# Patient Record
Sex: Male | Born: 1965 | Race: White | Hispanic: No | Marital: Married | Smoking: Never smoker
Health system: Southern US, Community
[De-identification: ages and names within clinical notes are randomized; demographics above are authoritative.]

## PROBLEM LIST (undated history)

## (undated) DIAGNOSIS — I10 Essential (primary) hypertension: Secondary | ICD-10-CM

## (undated) DIAGNOSIS — G8929 Other chronic pain: Secondary | ICD-10-CM

## (undated) DIAGNOSIS — K921 Melena: Secondary | ICD-10-CM

## (undated) DIAGNOSIS — F329 Major depressive disorder, single episode, unspecified: Secondary | ICD-10-CM

## (undated) DIAGNOSIS — F32A Depression, unspecified: Secondary | ICD-10-CM

## (undated) DIAGNOSIS — J302 Other seasonal allergic rhinitis: Secondary | ICD-10-CM

## (undated) DIAGNOSIS — R51 Headache: Secondary | ICD-10-CM

## (undated) DIAGNOSIS — F419 Anxiety disorder, unspecified: Secondary | ICD-10-CM

## (undated) DIAGNOSIS — B019 Varicella without complication: Secondary | ICD-10-CM

## (undated) DIAGNOSIS — K580 Irritable bowel syndrome with diarrhea: Secondary | ICD-10-CM

## (undated) DIAGNOSIS — K589 Irritable bowel syndrome without diarrhea: Secondary | ICD-10-CM

## (undated) HISTORY — DX: Depression, unspecified: F32.A

## (undated) HISTORY — DX: Varicella without complication: B01.9

## (undated) HISTORY — DX: Irritable bowel syndrome with diarrhea: K58.0

## (undated) HISTORY — DX: Essential (primary) hypertension: I10

## (undated) HISTORY — DX: Anxiety disorder, unspecified: F41.9

## (undated) HISTORY — DX: Major depressive disorder, single episode, unspecified: F32.9

## (undated) HISTORY — DX: Other chronic pain: G89.29

## (undated) HISTORY — DX: Melena: K92.1

## (undated) HISTORY — DX: Irritable bowel syndrome without diarrhea: K58.9

## (undated) HISTORY — DX: Headache: R51

## (undated) HISTORY — DX: Other seasonal allergic rhinitis: J30.2

---
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
## 1993-04-19 HISTORY — PX: ANAL FISTULECTOMY: SHX1139

## 1994-04-19 HISTORY — PX: ANAL FISTULECTOMY: SHX1139

## 2007-04-20 HISTORY — PX: COLONOSCOPY: SHX174

## 2012-04-19 HISTORY — PX: COLONOSCOPY: SHX174

## 2012-07-03 ENCOUNTER — Telehealth: Payer: Self-pay | Admitting: *Deleted

## 2012-07-03 ENCOUNTER — Encounter: Payer: Self-pay | Admitting: Internal Medicine

## 2012-07-03 ENCOUNTER — Ambulatory Visit (INDEPENDENT_AMBULATORY_CARE_PROVIDER_SITE_OTHER): Payer: BC Managed Care – PPO | Admitting: Internal Medicine

## 2012-07-03 VITALS — BP 120/80 | HR 69 | Temp 98.0°F | Resp 16 | Ht 72.0 in | Wt 212.2 lb

## 2012-07-03 DIAGNOSIS — K5190 Ulcerative colitis, unspecified, without complications: Secondary | ICD-10-CM

## 2012-07-03 DIAGNOSIS — R635 Abnormal weight gain: Secondary | ICD-10-CM

## 2012-07-03 DIAGNOSIS — Z87448 Personal history of other diseases of urinary system: Secondary | ICD-10-CM | POA: Insufficient documentation

## 2012-07-03 DIAGNOSIS — F411 Generalized anxiety disorder: Secondary | ICD-10-CM

## 2012-07-03 DIAGNOSIS — E349 Endocrine disorder, unspecified: Secondary | ICD-10-CM

## 2012-07-03 DIAGNOSIS — F418 Other specified anxiety disorders: Secondary | ICD-10-CM | POA: Insufficient documentation

## 2012-07-03 DIAGNOSIS — K589 Irritable bowel syndrome without diarrhea: Secondary | ICD-10-CM | POA: Insufficient documentation

## 2012-07-03 DIAGNOSIS — E291 Testicular hypofunction: Secondary | ICD-10-CM

## 2012-07-03 MED ORDER — ALPRAZOLAM 0.5 MG PO TABS: 0.5000 mg | ORAL_TABLET | Freq: Every evening | ORAL | Status: DC | PRN

## 2012-07-03 MED ORDER — ESCITALOPRAM OXALATE 20 MG PO TABS: 20.0000 mg | ORAL_TABLET | Freq: Every day | ORAL | Status: DC

## 2012-07-03 NOTE — Assessment & Plan Note (Signed)
Repeat level to be checked.  Will consider Endocrine referral.

## 2012-07-03 NOTE — Assessment & Plan Note (Signed)
Primary symptoms is insomnia despite regular use of lexapro. Trial of low dose alprazolam.

## 2012-07-03 NOTE — Assessment & Plan Note (Signed)
Diagnosed by colonoscopy in 2010.  No treatment despite recurrent bloody stools.  We discussed referral to [facility] or  [facility], (Dr Mechele Collin) for treatment and surveillance colonoscopy .

## 2012-07-03 NOTE — Progress Notes (Signed)
Patient ID: Travis Bennett, male   DOB: [birth year], 47 y.o.   MRN: [id number]  Patient Active Problem List  Diagnosis  . Ulcerative colitis  . History of hematuria  . Generalized anxiety disorder  . Hypotestosteronism    Subjective:  CC:   Chief Complaint  Patient presents with  . Establish Care    HPI:   Travis Bennett is a 47 y.o. male who presents as a new patient to establish primary care with the chief complaint of Diarrhea.  His first colonoscopy was at age 63 in [place]  for FH of colon CA.  His father had 10 polyps at age 3  [facility] had colon ca.  Upper sigmoid flex 2 years ago,  Which confirmed ulcerative colitis which was diagnosed after last colonoscopy in 2010.  His conditrion is aggravated by diet and stress. His UC has not been treated with medications despite frequent flares aggravated bu use of caffeine.  He has bloody stools at least monthly,  With 2 to 4 days of bleeding than a few weeks without any symptoms .  Even when not bleeding he has 5 to 10 stools daily,.   He has a history of an episode of hematuria that was evaluated by Urology .  Workup was unlcear  No source was found. .   Weight gain ,  Has never been over 200 lbs.  Not exercising for the past 6 months, due to nasty divorce  New job, new move. Quality of life is better.  Relocated in [place].  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  Anxiety:  His prior PCP  Started lexapro 20 mg . He has recurrent  Insomnia   And has not taken  [place] or sonata in over a year .  He finds himself making lists at night of things he needs to do which is keeping him awake. He has joint custody of children.  Low normal testosterone.  He has tried testosterone injections which lowered his levels even more due to positive feedback inhibition.      Past Medical History  Diagnosis Date  . Blood in stool   . Chicken pox   . Depression   . Chronic headaches   .  Ulcerative colitis     Past Surgical History  Procedure Laterality Date  . Tonsillectomy and adenoidectomy  1970  . Anal fistulectomy  1996  . Anal fistulectomy N/A 1995    Family History  Problem Relation Age of Onset  . Hyperlipidemia Mother   . Cancer Maternal Grandmother     Ovarian  . Alcohol abuse Maternal Grandmother   . Alzheimer's disease Paternal Grandmother   . Cancer Paternal Grandfather     colon  . Heart disease Paternal Grandfather   . Cancer Father     throat Ca,  tobacco   . Heart disease Maternal Grandfather 69    AMI   . Early death Maternal Aunt     History   Social History  . Marital Status: Single    Spouse Name: N/A    Number of Children: N/A  . Years of Education: N/A   Occupational History  . Not on file.   Social History Main Topics  . Smoking status: Never Smoker   . Smokeless tobacco: Never Used  . Alcohol Use: Yes  . Drug Use: No  . Sexually Active: Not on file   Other Topics Concern  . Not  on file   Social History Narrative  . No narrative on file       @ALLHX @    Review of Systems:   The remainder of the review of systems was negative except those addressed in the HPI.       Objective:  BP 120/80  Pulse 69  Temp(Src) 98 F (36.7 C) (Oral)  Resp 16  Ht 6' (1.829 m)  Wt 212 lb 4 oz (96.276 kg)  BMI 28.78 kg/m2  SpO2 98%  General appearance: alert, cooperative and appears stated age Ears: normal TM's and external ear canals both ears Throat: lips, mucosa, and tongue normal; teeth and gums normal Neck: no adenopathy, no carotid bruit, supple, symmetrical, trachea midline and thyroid not enlarged, symmetric, no tenderness/mass/nodules Back: symmetric, no curvature. ROM normal. No CVA tenderness. Lungs: clear to auscultation bilaterally Heart: regular rate and rhythm, S1, S2 normal, no murmur, click, rub or gallop Abdomen: soft, non-tender; bowel sounds normal; no masses,  no organomegaly Pulses: 2+ and  symmetric Skin: Skin color, texture, turgor normal. No rashes or lesions Lymph nodes: Cervical, supraclavicular, and axillary nodes normal.  Assessment and Plan:  Ulcerative colitis Diagnosed by colonoscopy in 2010.  No treatment despite recurrent bloody stools.  We discussed referral to [facility] or  [facility], (Dr Mechele Collin) for treatment and surveillance colonoscopy .   History of hematuria His prior urologic evaluation was done but no diagnosis was found, .  Records requested  Generalized anxiety disorder Primary symptoms is insomnia despite regular use of lexapro. Trial of low dose alprazolam.   Hypotestosteronism Repeat level to be checked.  Will consider Endocrine referral.    Updated Medication List Outpatient Encounter Prescriptions as of 07/03/2012  Medication Sig Dispense Refill  . acetaminophen (TYLENOL) 325 MG tablet Take 650 mg by mouth every 6 (six) hours as needed for pain.      Marland Kitchen escitalopram (LEXAPRO) 20 MG tablet Take 1 tablet (20 mg total) by mouth daily.  90 tablet  2  . saccharomyces boulardii (FLORASTOR) 250 MG capsule Take 250 mg by mouth 2 (two) times daily.      . [DISCONTINUED] escitalopram (LEXAPRO) 20 MG tablet Take 20 mg by mouth daily.      Marland Kitchen ALPRAZolam (XANAX) 0.5 MG tablet Take 1 tablet (0.5 mg total) by mouth at bedtime as needed for sleep.  30 tablet  3   No facility-administered encounter medications on file as of 07/03/2012.     No orders of the defined types were placed in this encounter.    No Follow-up on file.

## 2012-07-03 NOTE — Telephone Encounter (Signed)
Pt is coming in for labs tomorrow 03.18.2014 what labs and dx would you like?

## 2012-07-03 NOTE — Patient Instructions (Addendum)
Return at your convenience for fasting labs  I am prescribing low dose alprazolam to help you relax enough to fall asleep or return to sleep. Start with 1/2 tablet  Referral To Dr. Kerrin Mo,  [facility] or [facility] Dr.  Erick Blinks   Dentist:  Des[facility] on [address] (formerly [place])

## 2012-07-03 NOTE — Assessment & Plan Note (Signed)
His prior urologic evaluation was done but no diagnosis was found, .  Records requested

## 2012-07-04 ENCOUNTER — Other Ambulatory Visit: Payer: BC Managed Care – PPO

## 2012-09-28 LAB — HM COLONOSCOPY: HM Colonoscopy: NORMAL

## 2012-11-08 ENCOUNTER — Telehealth: Payer: Self-pay | Admitting: Internal Medicine

## 2012-11-08 MED ORDER — ESCITALOPRAM OXALATE 20 MG PO TABS: 20.0000 mg | ORAL_TABLET | Freq: Every day | ORAL | Status: DC

## 2012-11-08 NOTE — Telephone Encounter (Signed)
Pt states he is needing a refill on his Lexapro to [facility]. Pharmacy is only dispensing #30 rather than 90.

## 2012-11-08 NOTE — Telephone Encounter (Signed)
Pt was written script for escitalopram and was told it did not expire but when he called pharmacy and was told they fax refill request to [place].  Pt wanting to make sure we have received.Marland Kitchen

## 2012-11-09 ENCOUNTER — Other Ambulatory Visit: Payer: BC Managed Care – PPO

## 2012-11-28 ENCOUNTER — Encounter: Payer: Self-pay | Admitting: Internal Medicine

## 2012-11-28 ENCOUNTER — Ambulatory Visit (INDEPENDENT_AMBULATORY_CARE_PROVIDER_SITE_OTHER): Payer: BC Managed Care – PPO | Admitting: Internal Medicine

## 2012-11-28 VITALS — BP 148/98 | HR 74 | Temp 98.4°F | Resp 14 | Wt 214.2 lb

## 2012-11-28 DIAGNOSIS — K589 Irritable bowel syndrome without diarrhea: Secondary | ICD-10-CM

## 2012-11-28 DIAGNOSIS — R635 Abnormal weight gain: Secondary | ICD-10-CM

## 2012-11-28 DIAGNOSIS — F411 Generalized anxiety disorder: Secondary | ICD-10-CM

## 2012-11-28 DIAGNOSIS — K5190 Ulcerative colitis, unspecified, without complications: Secondary | ICD-10-CM

## 2012-11-28 DIAGNOSIS — R197 Diarrhea, unspecified: Secondary | ICD-10-CM

## 2012-11-28 DIAGNOSIS — E349 Endocrine disorder, unspecified: Secondary | ICD-10-CM

## 2012-11-28 DIAGNOSIS — E291 Testicular hypofunction: Secondary | ICD-10-CM

## 2012-11-28 LAB — CBC WITH DIFFERENTIAL/PLATELET
Basophils Relative: 0.6 % (ref 0.0–3.0)
Eosinophils Relative: 1.2 % (ref 0.0–5.0)
HCT: 47.5 % (ref 39.0–52.0)
Lymphs Abs: 1.4 10*3/uL (ref 0.7–4.0)
MCV: 89.2 fl (ref 78.0–100.0)
Monocytes Absolute: 0.6 10*3/uL (ref 0.1–1.0)
Monocytes Relative: 9.9 % (ref 3.0–12.0)
RBC: 5.32 Mil/uL (ref 4.22–5.81)
WBC: 5.7 10*3/uL (ref 4.5–10.5)

## 2012-11-28 LAB — COMPREHENSIVE METABOLIC PANEL
Albumin: 4.8 g/dL (ref 3.5–5.2)
Alkaline Phosphatase: 63 U/L (ref 39–117)
BUN: 12 mg/dL (ref 6–23)
CO2: 28 mEq/L (ref 19–32)
GFR: 75.27 mL/min (ref >60.00)
Glucose, Bld: 100 mg/dL — ABNORMAL HIGH (ref 70–99)
Potassium: 4.8 mEq/L (ref 3.5–5.1)
Total Bilirubin: 1 mg/dL (ref 0.3–1.2)
Total Protein: 7.4 g/dL (ref 6.0–8.3)

## 2012-11-28 LAB — C-REACTIVE PROTEIN: CRP: <0.5 mg/dL (ref 0.5–20.0)

## 2012-11-28 LAB — LIPID PANEL
Cholesterol: 194 mg/dL (ref 0–200)
VLDL: 32 mg/dL (ref 0.0–40.0)

## 2012-11-28 LAB — TSH: TSH: 1.17 u[IU]/mL (ref 0.35–5.50)

## 2012-11-28 MED ORDER — DICYCLOMINE HCL 20 MG PO TABS: 20.0000 mg | ORAL_TABLET | Freq: Four times a day (QID) | ORAL | Status: DC

## 2012-11-28 MED ORDER — TRAZODONE HCL 50 MG PO TABS: 25.0000 mg | ORAL_TABLET | Freq: Every evening | ORAL | Status: DC | PRN

## 2012-11-28 NOTE — Assessment & Plan Note (Addendum)
Confirmed with repeat colonoscopy.  Trial of dicylcomine.  GAD under treatment with lexapro.  Return in 2 weeks adn add levsin and.or PPI.  encouraged to try gluten free diet

## 2012-11-28 NOTE — Patient Instructions (Addendum)
Trial of dicyclomine,  And antispasmodic,  For your IBS  You can use immodium as needed  trazadone trial for insomnia

## 2012-11-28 NOTE — Progress Notes (Signed)
Patient ID: Travis Bennett, male   DOB: [birth year], 47 y.o.   MRN: [id number]   Patient Active Problem List   Diagnosis Date Noted  . History of hematuria 07/03/2012  . Generalized anxiety disorder 07/03/2012  . Hypotestosteronism 07/03/2012  . Irritable bowel syndrome     Subjective:  CC:   Chief Complaint  Patient presents with  . Follow-up    anxiety and GI issue's having rectal bleeding.    HPI:   Travis Bennett a 47 y.o. male who presents Acute visit for multiple uncontrolled symptoms including diarrhea , abdominal bloating and cramping,  daytime anxiety with new onset elevated blood pressure and worsening  Insomnia.  He is feeling generally poor.  He is having increasing work stressors complicated by his frequent diarrhea which is occurring 4 to 5 times daily.  He feels that due to the adversarial culture at [employer], where he works, he has developed GAD, IBS and is miserable. Her has had a 20 lb wt gain since working there because his supervisor does not respect boundaries and intrudes into his personal life 7 days per week.  He  .  Very worried about his declining health.  He had  Recent episode of chest pain with left arm tingling, but has been too busy to have an evaluation  bc his employer will not give him the time off,.  He is having 4 to 8 loos stools daily because of IBS.  Recently his supervisor  took a International aid/development worker of him coming out of the office bathroom and sent a picture of "Travis Bennett's new office" to the entire department. He has confronted his supervisor about this inappropriate behavior but nothing has changed. He took a vacation recently and the morning  of his vacation his employer texted him a list of topics to research while he was on vacation.  He and his entire office received texts and e mails from this suupervisor on Fridays evenigns and sundays regularly.     His GI issue has been evaluated recently with colonoscopy and IBS,  Not ulcerative colitis was  confirmed .  He continues  to have rectal bleeding. An EGD was not done because he was not referred/ He reports abdominal pain,  Bloating,  Cramping and fecal tenesmus . A celiac panel was done per apteitn.  His daughter has celiac disease.   Obesity:  He has not been able to start an exercise program because of the frequent communications from his employer that he feels compelled to return .       Past Medical History  Diagnosis Date  . Blood in stool   . Chicken pox   . Depression   . Chronic headaches   . Ulcerative colitis     Past Surgical History  Procedure Laterality Date  . Tonsillectomy and adenoidectomy  1970  . Anal fistulectomy  1996  . Anal fistulectomy N/A 1995       The following portions of the patient's history were reviewed and updated as appropriate: Allergies, current medications, and problem list.    Review of Systems:   Patient denies headache, fevers,, unintentional weight loss, skin rash, eye pain, sinus congestion and sinus pain, sore throat, dysphagia,  hemoptysis , cough, dyspnea, wheezing, alpitations, orthopnea, edema,  nausea, melena, constipation, flank pain, dysuria, hematuria, urinary  Frequency, nocturia, numbness, tingling, seizures,  Focal weakness, Loss of consciousness,  Tremor,  and suicidal ideation.     History   Social History  . Marital Status:  Single    Spouse Name: N/A    Number of Children: N/A  . Years of Education: N/A   Occupational History  . Not on file.   Social History Main Topics  . Smoking status: Never Smoker   . Smokeless tobacco: Never Used  . Alcohol Use: Yes  . Drug Use: No  . Sexually Active: Not on file   Other Topics Concern  . Not on file   Social History Narrative  . No narrative on file    Objective:  Filed Vitals:   11/28/12 0812  BP: 148/98  Pulse: 74  Temp: 98.4 F (36.9 C)  Resp: 14     General appearance: alert, cooperative and appears stated age Ears: normal TM's and  external ear canals both ears Throat: lips, mucosa, and tongue normal; teeth and gums normal Neck: no adenopathy, no carotid bruit, supple, symmetrical, trachea midline and thyroid not enlarged, symmetric, no tenderness/mass/nodules Back: symmetric, no curvature. ROM normal. No CVA tenderness. Lungs: clear to auscultation bilaterally Heart: regular rate and rhythm, S1, S2 normal, no murmur, click, rub or gallop Abdomen: soft, non-tender; bowel sounds normal; no masses,  no organomegaly Pulses: 2+ and symmetric Skin: Skin color, texture, turgor normal. No rashes or lesions Lymph nodes: Cervical, supraclavicular, and axillary nodes normal.  Assessment and Plan:  Irritable bowel syndrome Confirmed with repeat colonoscopy.  Trial of dicylcomine.  GAD under treatment with lexapro.  Return in 2 weeks adn add levsin and.or PPI.  encouraged to try gluten free diet  Generalized anxiety disorder Adding trazodone for chronic insomnia.  symptosm are currently aggravated by work stressors. Recommended one month of rest and FMLA will be advised.   A total of 40 minutes was spent with patient more than half of which was spent in counseling, reviewing records from other providers and coordination of care.   Updated Medication List Outpatient Encounter Prescriptions as of 11/28/2012  Medication Sig Dispense Refill  . acetaminophen (TYLENOL) 325 MG tablet Take 650 mg by mouth every 6 (six) hours as needed for pain.      Marland Kitchen escitalopram (LEXAPRO) 20 MG tablet Take 1 tablet (20 mg total) by mouth daily.  30 tablet  2  . saccharomyces boulardii (FLORASTOR) 250 MG capsule Take 250 mg by mouth 2 (two) times daily.      Marland Kitchen ALPRAZolam (XANAX) 0.5 MG tablet Take 1 tablet (0.5 mg total) by mouth at bedtime as needed for sleep.  30 tablet  3  . dicyclomine (BENTYL) 20 MG tablet Take 1 tablet (20 mg total) by mouth every 6 (six) hours.  120 tablet  3  . traZODone (DESYREL) 50 MG tablet Take 0.5-1 tablets (25-50 mg  total) by mouth at bedtime as needed for sleep.  30 tablet  3   No facility-administered encounter medications on file as of 11/28/2012.     No orders of the defined types were placed in this encounter.    No Follow-up on file.

## 2012-11-28 NOTE — Assessment & Plan Note (Signed)
Adding trazodone for chronic insomnia.  symptosm are currently aggravated by work stressors. Recommended one month of rest and FMLA will be advised.

## 2012-11-29 LAB — TESTOSTERONE, FREE, TOTAL, SHBG
Sex Hormone Binding: 28 nmol/L (ref 13–71)
Testosterone, Free: 44.9 pg/mL — ABNORMAL LOW (ref 47.0–244.0)
Testosterone-% Free: 2.1 % (ref 1.6–2.9)
Testosterone: 214 ng/dL — ABNORMAL LOW (ref 300–890)

## 2012-12-04 DIAGNOSIS — Z0279 Encounter for issue of other medical certificate: Secondary | ICD-10-CM

## 2012-12-11 ENCOUNTER — Encounter: Payer: Self-pay | Admitting: *Deleted

## 2012-12-11 ENCOUNTER — Encounter: Payer: Self-pay | Admitting: Gastroenterology

## 2012-12-12 ENCOUNTER — Encounter: Payer: Self-pay | Admitting: Internal Medicine

## 2012-12-12 ENCOUNTER — Ambulatory Visit (INDEPENDENT_AMBULATORY_CARE_PROVIDER_SITE_OTHER): Payer: BC Managed Care – PPO | Admitting: Internal Medicine

## 2012-12-12 VITALS — BP 148/100 | HR 64 | Temp 98.2°F | Resp 14 | Wt 213.5 lb

## 2012-12-12 DIAGNOSIS — E291 Testicular hypofunction: Secondary | ICD-10-CM

## 2012-12-12 DIAGNOSIS — K589 Irritable bowel syndrome without diarrhea: Secondary | ICD-10-CM

## 2012-12-12 DIAGNOSIS — F411 Generalized anxiety disorder: Secondary | ICD-10-CM

## 2012-12-12 DIAGNOSIS — R030 Elevated blood-pressure reading, without diagnosis of hypertension: Secondary | ICD-10-CM | POA: Insufficient documentation

## 2012-12-12 DIAGNOSIS — E349 Endocrine disorder, unspecified: Secondary | ICD-10-CM

## 2012-12-12 MED ORDER — ALPRAZOLAM 0.5 MG PO TABS: 0.5000 mg | ORAL_TABLET | Freq: Every evening | ORAL | Status: DC | PRN

## 2012-12-12 NOTE — Assessment & Plan Note (Signed)
He has no prior history of hypertension. He will check his blood pressure several times over the next 3-4 weeks and to submit readings for evaluation.  

## 2012-12-12 NOTE — Assessment & Plan Note (Signed)
Improving with management of anxiety and use of bentyl and probiotics

## 2012-12-12 NOTE — Progress Notes (Signed)
Patient ID: Travis Bennett, male   DOB: [birth year], 47 y.o.   MRN: [id number]   Patient Active Problem List   Diagnosis Date Noted  . Elevated blood pressure reading without diagnosis of hypertension 12/12/2012  . History of hematuria 07/03/2012  . Generalized anxiety disorder 07/03/2012  . Hypotestosteronism 07/03/2012  . Irritable bowel syndrome     Subjective:  CC:   Chief Complaint  Patient presents with  . Follow-up    lab low testosterone level    HPI:   Travis Bennett a 47 y.o. male who presents Follow up on anxiety,  Elevated blood pressure blood pressure , anxiety, Low testosterone, and IBS.  He has been on FMLA for one week and is finally starting to see an improvement in symptoms except for the insomnia,  Trazodone has not improved his insomnia at all. He is wondering whether melatonin for help.  He denies any subsequent  rectal bleeding but continues to have tenesmus and one episode of fecal incontinence .  He  is tolerating a trial of dicyclomine but has been taking it inconsistently .   He has a long history of low testosterone levels. His prior treatment trials with injectable testosterone reportedly caused even lower testosterone levels. This is an unusual reaction but he said states that she did some research and found it he could happen  as a result of negative feedback loop. He cannot recall whether his for repeat levels were tested as troughs or mid week.  He has 2 small children so transdermal testosterone would not be an ideal choice as an alternative.  Elevated  blood pressure he has had 2 elevated reading thus far.  He has not been exercising regularly and has not checked his bp away from the office.  No use of NSAIDs, stimulans or decongestants.    Past Medical History  Diagnosis Date  . Blood in stool   . Chicken pox   . Depression   . Chronic headaches   . Ulcerative colitis     Past Surgical History  Procedure Laterality Date  . Tonsillectomy and  adenoidectomy  1970  . Anal fistulectomy  1996  . Anal fistulectomy N/A 1995       The following portions of the patient's history were reviewed and updated as appropriate: Allergies, current medications, and problem list.    Review of Systems:   12 Pt  review of systems was negative except those addressed in the HPI,     History   Social History  . Marital Status: Single    Spouse Name: N/A    Number of Children: N/A  . Years of Education: N/A   Occupational History  . Not on file.   Social History Main Topics  . Smoking status: Never Smoker   . Smokeless tobacco: Never Used  . Alcohol Use: Yes  . Drug Use: No  . Sexual Activity: Not on file   Other Topics Concern  . Not on file   Social History Narrative  . No narrative on file    Objective:  Filed Vitals:   12/12/12 0814  BP: 148/100  Pulse: 64  Temp: 98.2 F (36.8 C)  Resp: 14     General appearance: alert, cooperative and appears stated age Ears: normal TM's and external ear canals both ears Throat: lips, mucosa, and tongue normal; teeth and gums normal Neck: no adenopathy, no carotid bruit, supple, symmetrical, trachea midline and thyroid not enlarged, symmetric, no tenderness/mass/nodules Back: symmetric, no curvature. ROM  normal. No CVA tenderness. Lungs: clear to auscultation bilaterally Heart: regular rate and rhythm, S1, S2 normal, no murmur, click, rub or gallop Abdomen: soft, non-tender; bowel sounds normal; no masses,  no organomegaly Pulses: 2+ and symmetric Skin: Skin color, texture, turgor normal. No rashes or lesions Lymph nodes: Cervical, supraclavicular, and axillary nodes normal.  Assessment and Plan:  Hypotestosteronism Given his prior treatment failure with IM testosterone advised referral to Endocrinology for treatment   Generalized anxiety disorder Improving with rest  And lexapro,  But insomnia is not improved with trial of trazodone.  Trial of  alprazolam.  Irritable bowel syndrome Improving with management of anxiety and use of bentyl and probiotics  Elevated blood pressure reading without diagnosis of hypertension He has no prior history of hypertension. He will check his blood pressure several times over the next 3-4 weeks and to submit readings for evaluation.    Updated Medication List Outpatient Encounter Prescriptions as of 12/12/2012  Medication Sig Dispense Refill  . acetaminophen (TYLENOL) 325 MG tablet Take 650 mg by mouth every 6 (six) hours as needed for pain.      Marland Kitchen dicyclomine (BENTYL) 20 MG tablet Take 1 tablet (20 mg total) by mouth every 6 (six) hours.  120 tablet  3  . escitalopram (LEXAPRO) 20 MG tablet Take 1 tablet (20 mg total) by mouth daily.  30 tablet  2  . saccharomyces boulardii (FLORASTOR) 250 MG capsule Take 250 mg by mouth 2 (two) times daily.      . traZODone (DESYREL) 50 MG tablet Take 0.5-1 tablets (25-50 mg total) by mouth at bedtime as needed for sleep.  30 tablet  3  . ALPRAZolam (XANAX) 0.5 MG tablet Take 1 tablet (0.5 mg total) by mouth at bedtime as needed for sleep.  30 tablet  3  . [DISCONTINUED] ALPRAZolam (XANAX) 0.5 MG tablet Take 1 tablet (0.5 mg total) by mouth at bedtime as needed for sleep.  30 tablet  3   No facility-administered encounter medications on file as of 12/12/2012.     Orders Placed This Encounter  Procedures  . Ambulatory referral to Endocrinology    No Follow-up on file.

## 2012-12-12 NOTE — Assessment & Plan Note (Addendum)
Improving with rest  And lexapro,  But insomnia is not improved with trial of trazodone.  Trial of alprazolam.

## 2012-12-12 NOTE — Assessment & Plan Note (Signed)
Given his prior treatment failure with IM testosterone advised referral to Endocrinology for treatment

## 2013-01-01 ENCOUNTER — Ambulatory Visit (INDEPENDENT_AMBULATORY_CARE_PROVIDER_SITE_OTHER): Payer: BC Managed Care – PPO | Admitting: Internal Medicine

## 2013-01-01 ENCOUNTER — Encounter: Payer: Self-pay | Admitting: Internal Medicine

## 2013-01-01 VITALS — BP 132/82 | HR 78 | Temp 98.3°F | Resp 14 | Ht 72.25 in | Wt 216.5 lb

## 2013-01-01 DIAGNOSIS — R030 Elevated blood-pressure reading, without diagnosis of hypertension: Secondary | ICD-10-CM

## 2013-01-01 DIAGNOSIS — K589 Irritable bowel syndrome without diarrhea: Secondary | ICD-10-CM

## 2013-01-01 DIAGNOSIS — Z23 Encounter for immunization: Secondary | ICD-10-CM

## 2013-01-01 DIAGNOSIS — F411 Generalized anxiety disorder: Secondary | ICD-10-CM

## 2013-01-01 NOTE — Patient Instructions (Addendum)
I agree that you should continue current plan of therapy and return to work on Sept 29th .

## 2013-01-01 NOTE — Assessment & Plan Note (Addendum)
His elevated blood pressures have resolved with management of anxiety

## 2013-01-01 NOTE — Assessment & Plan Note (Addendum)
improving with use of bentyl.  He can continue to use this 3-4 times daily before meals. Continue probiotics as well.

## 2013-01-01 NOTE — Progress Notes (Signed)
Patient ID: Travis Bennett, male   DOB: 21-Oct-1965, 47 y.o.   MRN: [id number]  Patient Active Problem List   Diagnosis Date Noted  . Elevated blood pressure reading without diagnosis of hypertension 12/12/2012  . History of hematuria 07/03/2012  . Generalized anxiety disorder 07/03/2012  . Hypotestosteronism 07/03/2012  . Irritable bowel syndrome     Subjective:  CC:   Chief Complaint  Patient presents with  . Follow-up    No physical stated by patient    HPI:   Travis Bennett a 47 y.o. male who presents Followup on generalized anxiety disorder and irritable bowel syndrome. Patient has been on FMLA since August 12 when he presented with uncontrollable diarrhea,  uncontrolled anxiety, and new onset hypertension all of which have been aggravated by a hostile environment and an Counselling psychologist at work. I recommended that patient take a leave of absence to get his health in order as his health was becoming a victim of personal neglect. Patient had been working in excess of 80 hours per week. He had been actually harassed at work for having diarrhea. His supervisor had taken a picture of him coming out of the restroom and sent it to all of his colleagues at work with the caption "this is Travis Bennett's new office. " I have been prescribing him an SSRI along with an anxiolytic. At last visit I added dicyclomine for control of diarrhea. His symptoms have all improved moderately but he has not been able to relax because he continues to receive calls from clients and calls from the insurance company. He does not feel well enough to return to work on September 22 and is asking for an additional  week.  Although his blood pressure issue has resolved, he continues to have days of uncontrollable loose stools and is not sleeping well. Plans to return to work Sept 29th.    Past Medical History  Diagnosis Date  . Blood in stool   . Chicken pox   . Depression   . Chronic headaches   . Ulcerative  colitis     Past Surgical History  Procedure Laterality Date  . Tonsillectomy and adenoidectomy  1970  . Anal fistulectomy  1996  . Anal fistulectomy N/A 1995       The following portions of the patient's history were reviewed and updated as appropriate: Allergies, current medications, and problem list.    Review of Systems:   12 Pt  review of systems was negative except those addressed in the HPI,     History   Social History  . Marital Status: Single    Spouse Name: N/A    Number of Children: N/A  . Years of Education: N/A   Occupational History  . Not on file.   Social History Main Topics  . Smoking status: Never Smoker   . Smokeless tobacco: Never Used  . Alcohol Use: Yes  . Drug Use: No  . Sexual Activity: Not on file   Other Topics Concern  . Not on file   Social History Narrative  . No narrative on file    Objective:  Filed Vitals:   01/01/13 0834  BP: 132/82  Pulse: 78  Temp: 98.3 F (36.8 C)  Resp: 14     General appearance: alert, cooperative and appears stated age Ears: normal TM's and external ear canals both ears Throat: lips, mucosa, and tongue normal; teeth and gums normal Neck: no adenopathy, no carotid bruit, supple, symmetrical, trachea midline and thyroid not  enlarged, symmetric, no tenderness/mass/nodules Back: symmetric, no curvature. ROM normal. No CVA tenderness. Lungs: clear to auscultation bilaterally Heart: regular rate and rhythm, S1, S2 normal, no murmur, click, rub or gallop Abdomen: soft, non-tender; bowel sounds normal; no masses,  no organomegaly Pulses: 2+ and symmetric Skin: Skin color, texture, turgor normal. No rashes or lesions Lymph nodes: Cervical, supraclavicular, and axillary nodes normal.  Assessment and Plan:  Generalized anxiety disorder Improving with alprazolam .    Blood pressure has improved.  Wants to return to work on Sept 29th.   Irritable bowel syndrome improving with use of bentyl.  He  can continue to use this 3-4 times daily before meals. Continue probiotics as well.  Elevated blood pressure reading without diagnosis of hypertension His elevated blood pressures have resolved with management of anxiety    Updated Medication List Outpatient Encounter Prescriptions as of 01/01/2013  Medication Sig Dispense Refill  . acetaminophen (TYLENOL) 325 MG tablet Take 650 mg by mouth every 6 (six) hours as needed for pain.      Marland Kitchen ALPRAZolam (XANAX) 0.5 MG tablet Take 1 tablet (0.5 mg total) by mouth at bedtime as needed for sleep.  30 tablet  3  . dicyclomine (BENTYL) 20 MG tablet Take 1 tablet (20 mg total) by mouth every 6 (six) hours.  120 tablet  3  . escitalopram (LEXAPRO) 20 MG tablet Take 1 tablet (20 mg total) by mouth daily.  30 tablet  2  . saccharomyces boulardii (FLORASTOR) 250 MG capsule Take 250 mg by mouth 2 (two) times daily.      . [DISCONTINUED] traZODone (DESYREL) 50 MG tablet Take 0.5-1 tablets (25-50 mg total) by mouth at bedtime as needed for sleep.  30 tablet  3   No facility-administered encounter medications on file as of 01/01/2013.     Orders Placed This Encounter  Procedures  . Flu Vaccine QUAD 36+ mos PF IM (Fluarix)    No Follow-up on file.

## 2013-01-01 NOTE — Assessment & Plan Note (Addendum)
Improving with alprazolam .    Blood pressure has improved.  Wants to return to work on Sept 29th.

## 2013-01-04 ENCOUNTER — Encounter: Payer: Self-pay | Admitting: Internal Medicine

## 2013-01-10 DIAGNOSIS — Z0279 Encounter for issue of other medical certificate: Secondary | ICD-10-CM

## 2013-01-16 ENCOUNTER — Ambulatory Visit: Payer: BC Managed Care – PPO | Admitting: Internal Medicine

## 2013-01-25 ENCOUNTER — Ambulatory Visit: Payer: BC Managed Care – PPO | Admitting: Internal Medicine

## 2013-02-05 ENCOUNTER — Ambulatory Visit: Payer: BC Managed Care – PPO | Admitting: Internal Medicine

## 2013-02-26 ENCOUNTER — Ambulatory Visit: Payer: BC Managed Care – PPO | Admitting: Internal Medicine

## 2013-03-05 ENCOUNTER — Ambulatory Visit (INDEPENDENT_AMBULATORY_CARE_PROVIDER_SITE_OTHER): Payer: Commercial Indemnity | Admitting: Internal Medicine

## 2013-03-05 ENCOUNTER — Encounter: Payer: Self-pay | Admitting: Internal Medicine

## 2013-03-05 VITALS — BP 122/70 | HR 76 | Temp 98.6°F | Resp 10 | Ht 72.0 in | Wt 217.0 lb

## 2013-03-05 DIAGNOSIS — E291 Testicular hypofunction: Secondary | ICD-10-CM

## 2013-03-05 DIAGNOSIS — E349 Endocrine disorder, unspecified: Secondary | ICD-10-CM

## 2013-03-05 NOTE — Patient Instructions (Addendum)
Please come back for a follow-up appointment in 3 months. Please go to [facility] at 8 am, fasting, for lab. Please join MyChart.

## 2013-03-05 NOTE — Progress Notes (Signed)
Patient ID: Travis Bennett, male   DOB: Dec 13, 1965, 47 y.o.   MRN: [id number]  HPI: Travis Bennett is a 47 y.o.-year-old man, referred by his PCP, Dr. Darrick Huntsman, for evaluation and management of low testosterone.  Pt was dx'ed with hypogonadism in ~2011. She was treated with testosterone injections x 3-4 inj, however, his testosterone levels were lower when he was taking the injections. He is now not on any testosterone treatment. His last total and free testosterone levels (at 8:30 AM) were low: Component     Latest Ref Rng 11/28/2012  Testosterone     300 - 890 ng/dL 130 (L)  Sex Hormone Binding     13 - 71 nmol/L 28  Testosterone Free     47.0 - 244.0 pg/mL 44.9 (L)  Testosterone-% Freee.     1.6 - 2.9 % 2.1  He was referred to [facility] for further evaluation and treatment for his low testosterone. Of note, patient would like to avoid testosterone gels because he has small children at home, and he was to be cautious about transferring the medication to them.  He admits for slight decreased libido and fatigue Never had difficulty obtaining or maintaining an erection He gained 20-25 lbs in last year.  He gets very tired in the pm.  His memory is less sharp per his wife. He decreased his exercise a lot this year. No snoring.  Had trauma to testes - at 47 y/o during baseball No testicular irradiation - had a vasectomy in 2006. No h/o of mumps orchitis/h/o autoimmune ds. No h/o cryptorchidism No shrinking of testes. No very small testes (<5 ml) He grew and went through puberty like his peers No incomplete/delayed sexual development  No FH of hypogonadism/infertility  No infertility - has 2 children (6 total): 5,7,8,9,10,11 No breast discomfort/gynecomastia No loss of body hair (axillary/pubic)/decreased need for shaving No height loss No abnormal sense of smell (only allergies) No hot flushes No vision problems No worst HA of his life, but had HA when younger No FH of  hemochromatosis or pituitary tumors No chronic diseases other than IBS (has FH of it) No chronic pain. Not on opiates, does not take steroids.  No more than 2 drinks a day of alcohol at a time, and this is rarely No anabolic steroids use No herbal medicines He is on antidepressants (Lexapro). Tried Xanax - not using it anymore. No AI ds in his family, no FH of MS. He does not have family history of early cardiac disease.  ROS: Constitutional: + weight gain, + fatigue, no subjective hyperthermia/hypothermia, + poor sleep Eyes: no blurry vision, no xerophthalmia ENT: no sore throat, no nodules palpated in throat, no dysphagia/odynophagia, no hoarseness Cardiovascular: no CP/SOB/palpitations/leg swelling Respiratory: no cough/SOB Gastrointestinal: no N/V/+D/+C Musculoskeletal: no muscle/joint aches Skin: no rashes Neurological: no tremors/numbness/tingling/dizziness, + occas. HA Psychiatric: no depression/anxiety  Past Medical History  Diagnosis Date  . Blood in stool   . Chicken pox   . Depression   . Chronic headaches   . Ulcerative colitis    Past Surgical History  Procedure Laterality Date  . Tonsillectomy and adenoidectomy  1970  . Anal fistulectomy  1996  . Anal fistulectomy N/A 1995   History   Social History  . Marital Status: Single    Spouse Name: N/A    Number of Children: 2   Occupational History  . sales   Social History Main Topics  . Smoking status: Never Smoker   . Smokeless tobacco: Never  Used  . Alcohol Use: 3.5 oz/week    7 drink(s) per week  . Drug Use: No  . Sexual Activity: Yes    Partners: Female   Social History Narrative   Married: 6 kids (blended family)   Pleas Koch with work   Regular exercise: not recently   Caffeine use: coffee in the am   Current Outpatient Prescriptions on File Prior to Visit  Medication Sig Dispense Refill  . acetaminophen (TYLENOL) 325 MG tablet Take 650 mg by mouth every 6 (six) hours as needed for pain.       Marland Kitchen ALPRAZolam (XANAX) 0.5 MG tablet Take 1 tablet (0.5 mg total) by mouth at bedtime as needed for sleep.  30 tablet  3  . dicyclomine (BENTYL) 20 MG tablet Take 1 tablet (20 mg total) by mouth every 6 (six) hours.  120 tablet  3  . escitalopram (LEXAPRO) 20 MG tablet Take 1 tablet (20 mg total) by mouth daily.  30 tablet  2  . saccharomyces boulardii (FLORASTOR) 250 MG capsule Take 250 mg by mouth 2 (two) times daily.       No current facility-administered medications on file prior to visit.   No Known Allergies Family History  Problem Relation Age of Onset  . Hyperlipidemia Mother   . Cancer Maternal Grandmother     Ovarian  . Alcohol abuse Maternal Grandmother   . Alzheimer's disease Paternal Grandmother   . Cancer Paternal Grandfather     colon  . Heart disease Paternal Grandfather   . Cancer Father     throat Ca,  tobacco   . Heart disease Maternal Grandfather 81    AMI   . Early death Maternal Aunt    PE: BP 122/70  Pulse 76  Temp(Src) 98.6 F (37 C) (Oral)  Resp 10  Ht 6' (1.829 m)  Wt 217 lb (98.431 kg)  BMI 29.42 kg/m2  SpO2 97% Wt Readings from Last 3 Encounters:  03/05/13 217 lb (98.431 kg)  01/01/13 216 lb 8 oz (98.204 kg)  12/12/12 213 lb 8 oz (96.843 kg)   Constitutional: overweight, in NAD Eyes: PERRLA, EOMI, no exophthalmos ENT: moist mucous membranes, no thyromegaly, no cervical lymphadenopathy Cardiovascular: RRR, No MRG Respiratory: CTA B Gastrointestinal: abdomen soft, NT, ND, BS+ Musculoskeletal: no deformities, strength intact in all 4 Skin: moist, warm, no rashes Neurological: no tremor with outstretched hands, DTR normal in all 4 Genital exam: normal male escutcheon, no inguinal LAD, normal phallus, testes ~25 mL, no testicular masses, no penile discharge.   ASSESSMENT: 1. Low testosterone - also low free T   PLAN:  1. Low testosterone Patient with local total and free testosterone, as per last check 3 months ago, between 8 and 9 in  the morning. No further investigation has been done to investigate for primary or secondary hypogonadism, so I explained that we first need to find the cause for his low testosterone and then decide about further treatment. He is not very symptomatic from his low testosterone, he only has slight decreased libido and no problems with erections. He is fatigued though, but it is difficult to decide whether this is from his weight gain + lack of exercise + busy schedule or from his low testosterone. - I would first want to repeat his testosterone level fasting, at 8 AM, and if the free testosterone is still low, he will probably need treatment. We also need to rule out pituitary or testicular problems, along with hemochromatosis or diabetes. -  If he has low total with normal free testosterone, then the treatment is not exogenous testosterone, but improving his sleep, diet and exercise.  - We discussed about different ways and supplementing/replacing testosterone, to include injections (he does mention that his testosterone level was lower after the injections, but it is not clear when the testosterone level was drawn in relationship with the next injection), gels (he is not keen to this idea - see history of present illness), patches ( I think these would be better for him). If his estradiol is high normal or high, I think he can possibly benefit from clomiphene 25 mg daily. He is opened to this idea after I explained how it works. - since he ate this morning, she will return for labs fasting: Orders Placed This Encounter  Procedures  . Testosterone, free, total  . Estradiol  . hCG, quantitative, pregnancy  . LH  . FSH  . PSA  . TSH  . T4, free  . T3, free  . Prolactin  . Insulin-like growth factor  . Cortisol  . Hemoglobin A1C  . IBC panel  - I will see him back in 3 months, but we will be in touch about the above results and to decide about further treatment. I did discuss with him that if we  decide to start him on treatment, he will need a DRE.   Letter sent:  March 23, 2013   Dear Travis Bennett,  Below are the results from your recent visit:  HCG, QUANTITATIVE, PREGNANCY      Result Value Range   hCG, Beta Chain, Quant, S 0.50    LUTEINIZING HORMONE      Result Value Range   LH 2.79  1.50 - 9.30 mIU/mL  FOLLICLE STIMULATING HORMONE      Result Value Range   FSH 3.5  1.4 - 18.1 mIU/ML  PSA      Result Value Range   PSA 0.75  0.10 - 4.00 ng/mL  TSH      Result Value Range   TSH 1.33  0.35 - 5.50 uIU/mL  T4, FREE      Result Value Range   Free T4 0.75  0.60 - 1.60 ng/dL  T3, FREE      Result Value Range   T3, Free 3.3  2.3 - 4.2 pg/mL  CORTISOL      Result Value Range   Cortisol, Plasma 22.0    HEMOGLOBIN A1C      Result Value Range   Hemoglobin A1C 5.1  4.6 - 6.5 %  IBC PANEL      Result Value Range   Iron 151  42 - 165 ug/dL   Transferrin 478.2  956.2 - 360.0 mg/dL   Saturation Ratios 13.0  20.0 - 50.0 %  TESTOSTERONE, FREE, TOTAL      Result Value Range   Testosterone 291 (*) 300 - 890 ng/dL   Sex Hormone Binding 20  13 - 71 nmol/L   Testosterone, Free 73.9  47.0 - 244.0 pg/mL   Testosterone-% Free 2.5  1.6 - 2.9 %   Narrative:    Performed at:  [facility]                [address]                [place], [place] [address]  PROLACTIN      Result Value Range   Prolactin 8.2  2.1 - 17.1 ng/mL  Narrative:    Performed at:  [facility]                [address]                [place], [place] [address]  ESTRADIOL, FREE      Result Value Range   Estradiol, Free 0.46 (*)    Estradiol 20     Results Received 03/22/13     Narrative:    Performed at:  [facility]                [address]                [place], [place] [address]  INSULIN-LIKE GROWTH FACTOR      Result Value Range   Somatomedin (IGF-I) 129  55 - 213 ng/mL   Narrative:    Performed at:  [facility]                [address]                [place], [place] [address]   The test results are all normal (except the estradiol which is slightly above the upper limit of normal, but not significantly high). The total testosterone is low, but the free testosterone is normal, so, in this case, I would not recommend testosterone therapy. No apparent pituitary dysfunction. Also, no hemochromatosis (iron excess disorder). The thyroid tests are also normal.  If you have any questions or concerns, please don't hesitate to call.  Sincerely,  Carlus Pavlov, MD

## 2013-03-10 ENCOUNTER — Other Ambulatory Visit: Payer: Self-pay | Admitting: Internal Medicine

## 2013-03-14 ENCOUNTER — Other Ambulatory Visit (INDEPENDENT_AMBULATORY_CARE_PROVIDER_SITE_OTHER): Payer: Commercial Indemnity

## 2013-03-14 DIAGNOSIS — E349 Endocrine disorder, unspecified: Secondary | ICD-10-CM

## 2013-03-14 DIAGNOSIS — E291 Testicular hypofunction: Secondary | ICD-10-CM

## 2013-03-14 LAB — LUTEINIZING HORMONE: LH: 2.79 m[IU]/mL (ref 1.50–9.30)

## 2013-03-14 LAB — TSH: TSH: 1.33 u[IU]/mL (ref 0.35–5.50)

## 2013-03-14 LAB — T3, FREE: T3, Free: 3.3 pg/mL (ref 2.3–4.2)

## 2013-03-14 LAB — T4, FREE: Free T4: 0.75 ng/dL (ref 0.60–1.60)

## 2013-03-14 LAB — IBC PANEL
Iron: 151 ug/dL (ref 42–165)
Saturation Ratios: 44.1 % (ref 20.0–50.0)
Transferrin: 244.4 mg/dL (ref 212.0–360.0)

## 2013-03-14 LAB — FOLLICLE STIMULATING HORMONE: FSH: 3.5 m[IU]/mL (ref 1.4–18.1)

## 2013-03-14 LAB — HEMOGLOBIN A1C: Hgb A1c MFr Bld: 5.1 % (ref 4.6–6.5)

## 2013-03-15 LAB — PROLACTIN: Prolactin: 8.2 ng/mL (ref 2.1–17.1)

## 2013-03-16 LAB — INSULIN-LIKE GROWTH FACTOR: Somatomedin (IGF-I): 129 ng/mL (ref 55–213)

## 2013-03-16 LAB — TESTOSTERONE, FREE, TOTAL, SHBG
Sex Hormone Binding: 20 nmol/L (ref 13–71)
Testosterone, Free: 73.9 pg/mL (ref 47.0–244.0)
Testosterone: 291 ng/dL — ABNORMAL LOW (ref 300–890)

## 2013-03-21 ENCOUNTER — Other Ambulatory Visit: Payer: Self-pay | Admitting: Internal Medicine

## 2013-03-21 NOTE — Telephone Encounter (Signed)
Ok to refill 

## 2013-03-22 LAB — ESTRADIOL, FREE: Estradiol, Free: 0.46 pg/mL — ABNORMAL HIGH

## 2013-03-23 ENCOUNTER — Encounter: Payer: Self-pay | Admitting: Internal Medicine

## 2013-06-20 ENCOUNTER — Encounter: Payer: Self-pay | Admitting: Internal Medicine

## 2013-06-20 ENCOUNTER — Ambulatory Visit: Payer: Commercial Indemnity | Admitting: Internal Medicine

## 2013-06-23 ENCOUNTER — Other Ambulatory Visit: Payer: Self-pay | Admitting: Internal Medicine

## 2013-07-05 ENCOUNTER — Encounter: Payer: Self-pay | Admitting: Internal Medicine

## 2013-07-05 ENCOUNTER — Ambulatory Visit (INDEPENDENT_AMBULATORY_CARE_PROVIDER_SITE_OTHER): Payer: Commercial Indemnity | Admitting: Internal Medicine

## 2013-07-05 VITALS — BP 122/78 | HR 70 | Temp 97.9°F | Resp 12 | Wt 217.6 lb

## 2013-07-05 DIAGNOSIS — E291 Testicular hypofunction: Secondary | ICD-10-CM

## 2013-07-05 DIAGNOSIS — E349 Endocrine disorder, unspecified: Secondary | ICD-10-CM

## 2013-07-05 NOTE — Progress Notes (Signed)
Patient ID: Travis Bennett, male   DOB: July 28, 1965, 48 y.o.   MRN: [id number]  HPI: Travis Bennett is a 48 y.o.-year-old man, returning for f/u for low testosterone.  Reviewed hx: Pt was dx'ed with hypogonadism in ~2011. She was treated with testosterone injections x 3-4 inj, however, his testosterone levels were lower when he was taking the injections. He is now not on any testosterone treatment. His last total and free testosterone levels (at 8:30 AM) were low: Component     Latest Ref Rng 11/28/2012  Testosterone     300 - 890 ng/dL 214 (L)  Sex Hormone Binding     13 - 71 nmol/L 28  Testosterone Free     47.0 - 244.0 pg/mL 44.9 (L)  Testosterone-% Freee.     1.6 - 2.9 % 2.1  He was referred to [facility] for further evaluation and treatment for his low testosterone. Of note, patient would like to avoid testosterone gels because he has small children at home, and he was to be cautious about transferring the medication to them.  He admits for slight decreased libido and fatigue Never had difficulty obtaining or maintaining an erection He gained 20-25 lbs in last year.  He gets very tired in the pm.  His memory is less sharp per his wife. He decreased his exercise a lot this year. No snoring.  Had trauma to testes - at 48 y/o during baseball No testicular irradiation - had a vasectomy in 2006. No h/o of mumps orchitis/h/o autoimmune ds. No h/o cryptorchidism No shrinking of testes. No very small testes (<5 ml) He grew and went through puberty like his peers No incomplete/delayed sexual development  No FH of hypogonadism/infertility  No infertility - has 2 children (6 total): 5,7,8,9,10,11 No breast discomfort/gynecomastia No loss of body hair (axillary/pubic)/decreased need for shaving No height loss No abnormal sense of smell (only allergies) No hot flushes No vision problems No worst HA of his life, but had HA when younger No FH of hemochromatosis or pituitary  tumors No chronic diseases other than IBS (has FH of it) No chronic pain. Not on opiates, does not take steroids.  No more than 2 drinks a day of alcohol at a time, and this is rarely No anabolic steroids use No herbal medicines He is on antidepressants (Lexapro). Tried Xanax - not using it anymore. No AI ds in his family, no FH of MS. He does not have family history of early cardiac disease.  We repeated T level at last visit, along with other pituitary tests:  Component     Latest Ref Rng 03/14/2013  Testosterone     300 - 890 ng/dL 291 (L)  Sex Hormone Binding     13 - 71 nmol/L 20  Testosterone Free     47.0 - 244.0 pg/mL 73.9  Testosterone-% Free     1.6 - 2.9 % 2.5  Estradiol, Free      0.46 (H)  Estradiol      20  Results received      03/22/13  Iron     42 - 165 ug/dL 151  Transferrin     212.0 - 360.0 mg/dL 244.4  Saturation Ratios     20.0 - 50.0 % 44.1  hCG, Beta Chain, Quant, S      0.50  LH     1.50 - 9.30 mIU/mL 2.79  FSH     1.4 - 18.1 mIU/ML 3.5  PSA     0.10 -  4.00 ng/mL 0.75  TSH     0.35 - 5.50 uIU/mL 1.33  Free T4     0.60 - 1.60 ng/dL 0.75  T3, Free     2.3 - 4.2 pg/mL 3.3  Prolactin     2.1 - 17.1 ng/mL 8.2  Somatomedin (IGF-I)     55 - 213 ng/mL 129  Cortisol, Plasma      22.0  Hemoglobin A1C     4.6 - 6.5 % 5.1   The test results are all normal (except the estradiol which is slightly above the upper limit of normal, but not significantly high). The total testosterone is low, but the free testosterone is normal, so, in this case, no testosterone therapy recommended. No apparent pituitary dysfunction. Also, no hemochromatosis (iron excess disorder). The thyroid tests are also normal.  We discussed at last visit about diet and exercise to improve his testosterone, but no T tx needed. He tells me he was not good about diet and exercise with a lot of stress at work and small children at home.  ROS: Constitutional: + weight gain, +  fatigue, no subjective hyperthermia/hypothermia, + poor sleep Eyes: no blurry vision, no xerophthalmia ENT: no sore throat, no nodules palpated in throat, no dysphagia/odynophagia, no hoarseness Cardiovascular: no CP/SOB/palpitations/leg swelling Respiratory: no cough/SOB Gastrointestinal: no N/V/D/C Musculoskeletal: no muscle/joint aches Skin: no rashes Neurological: no tremors/numbness/tingling/dizziness, + occas. HA Urol: blood in urine  PE: BP 122/78  Pulse 70  Temp(Src) 97.9 F (36.6 C) (Oral)  Resp 12  Wt 217 lb 9.6 oz (98.703 kg)  SpO2 95% Wt Readings from Last 3 Encounters:  07/05/13 217 lb 9.6 oz (98.703 kg)  03/05/13 217 lb (98.431 kg)  01/01/13 216 lb 8 oz (98.204 kg)   Constitutional: overweight, in NAD Eyes: PERRLA, EOMI, no exophthalmos ENT: moist mucous membranes, no thyromegaly, no cervical lymphadenopathy Cardiovascular: RRR, No MRG Respiratory: CTA B Gastrointestinal: abdomen soft, NT, ND, BS+ Musculoskeletal: no deformities, strength intact in all 4 Skin: moist, warm, no rashes  ASSESSMENT: 1. Low testosterone - also low free T   PLAN:  1. Low testosterone Patient with recent low local total and normal free testosterone, as per last check 4 months ago - no primary or secondary hypogonadism. He is not very symptomatic from his low testosterone, no decreased libido and no problems with erections. He is fatigued though, but it is difficult to decide whether this is from his weight gain + lack of exercise + busy schedule or from his low testosterone. - we again discussed that for a low total with normal free testosterone,  the treatment is not exogenous testosterone, but improving his sleep, diet and exercise. He agrees to try this as he admits he has not been very disciplined lately - I will see him on a prn basis - will have a new Testosterone and a new estradiol level (since it was a little high at last visit) at his annual lab draw with PCP

## 2013-07-05 NOTE — Patient Instructions (Signed)
Please have a testosterone level and an estradiol at 8 am, fasting, at the time of your annual lab draw.

## 2013-08-21 ENCOUNTER — Other Ambulatory Visit: Payer: Self-pay | Admitting: Internal Medicine

## 2013-08-21 ENCOUNTER — Other Ambulatory Visit: Payer: Self-pay | Admitting: *Deleted

## 2013-09-18 ENCOUNTER — Other Ambulatory Visit: Payer: Self-pay | Admitting: Internal Medicine

## 2013-09-19 NOTE — Telephone Encounter (Signed)
Spoke with pt on need for appt, last visit 01/01/13, verbalized understanding. Pt driving at the moment, states he will call back later to schedule an appointment prior to his next refill being due.

## 2013-10-02 ENCOUNTER — Encounter: Payer: Self-pay | Admitting: Internal Medicine

## 2013-10-02 ENCOUNTER — Ambulatory Visit (INDEPENDENT_AMBULATORY_CARE_PROVIDER_SITE_OTHER): Payer: Commercial Indemnity | Admitting: Internal Medicine

## 2013-10-02 VITALS — BP 124/72 | HR 85 | Temp 98.0°F | Resp 16 | Ht 72.0 in | Wt 218.2 lb

## 2013-10-02 DIAGNOSIS — S39012A Strain of muscle, fascia and tendon of lower back, initial encounter: Secondary | ICD-10-CM

## 2013-10-02 DIAGNOSIS — K589 Irritable bowel syndrome without diarrhea: Secondary | ICD-10-CM

## 2013-10-02 DIAGNOSIS — E349 Endocrine disorder, unspecified: Secondary | ICD-10-CM

## 2013-10-02 DIAGNOSIS — E291 Testicular hypofunction: Secondary | ICD-10-CM

## 2013-10-02 DIAGNOSIS — IMO0002 Reserved for concepts with insufficient information to code with codable children: Secondary | ICD-10-CM

## 2013-10-02 DIAGNOSIS — F411 Generalized anxiety disorder: Secondary | ICD-10-CM

## 2013-10-02 MED ORDER — HYDROCODONE-ACETAMINOPHEN 5-325 MG PO TABS: 1.0000 | ORAL_TABLET | Freq: Four times a day (QID) | ORAL | Status: DC | PRN

## 2013-10-02 MED ORDER — CIPROFLOXACIN HCL 500 MG PO TABS: 500.0000 mg | ORAL_TABLET | Freq: Two times a day (BID) | ORAL | Status: DC

## 2013-10-02 MED ORDER — PROMETHAZINE HCL 25 MG PO TABS: 25.0000 mg | ORAL_TABLET | Freq: Three times a day (TID) | ORAL | Status: DC | PRN

## 2013-10-02 MED ORDER — PREDNISONE (PAK) 10 MG PO TABS: ORAL_TABLET | ORAL | Status: DC

## 2013-10-02 NOTE — Progress Notes (Signed)
Patient ID: Travis Bennett, male   DOB: 03-08-1966, 48 y.o.   MRN: [id number]  Patient Active Problem List   Diagnosis Date Noted  . Strain, back 10/03/2013  . Elevated blood pressure reading without diagnosis of hypertension 12/12/2012  . History of hematuria 07/03/2012  . Generalized anxiety disorder 07/03/2012  . Irritable bowel syndrome     Subjective:  CC:   Chief Complaint  Patient presents with  . Back Pain    radiates down right leg.Woke during the night 6/13/ and 6/14 pain rated at 10    HPI:   Travis Bennett is a 48 y.o. male who presents for Back pain.  He Woke  Up with right lower back pain 5 days ago    Piercing ,  Radiating to right testicle and right leg.   Tried working it out with a walk and used Advil,  Increased hydration  .after 2 days symptoms improved a little bit,  And today now about 60% improved . Has been doing a lot of yard work,  Some increased physical labor.  History of low back pain 7 yrs ago treated with epidural injection .  Leaving for his 2nd honeymoon in a few days.   No longer taking bentyl for IBS symptoms bc symptoms have improved   Low testosterone was evaluated by Dr. Cruzita Lederer who advised against supplementing based on normal free testosterone level and no attributable symptoms     Past Medical History  Diagnosis Date  . Blood in stool   . Chicken pox   . Depression   . Chronic headaches   . Ulcerative colitis     Past Surgical History  Procedure Laterality Date  . Tonsillectomy and adenoidectomy  1970  . Anal fistulectomy  1996  . Anal fistulectomy N/A 1995       The following portions of the patient's history were reviewed and updated as appropriate: Allergies, current medications, and problem list.    Review of Systems:   Patient denies headache, fevers, malaise, unintentional weight loss, skin rash, eye pain, sinus congestion and sinus pain, sore throat, dysphagia,  hemoptysis , cough, dyspnea, wheezing, chest pain,  palpitations, orthopnea, edema, abdominal pain, nausea, melena, diarrhea, constipation, flank pain, dysuria, hematuria, urinary  Frequency, nocturia, numbness, tingling, seizures,  Focal weakness, Loss of consciousness,  Tremor, insomnia, depression, anxiety, and suicidal ideation.     History   Social History  . Marital Status: Single    Spouse Name: N/A    Number of Children: N/A  . Years of Education: N/A   Occupational History  . Not on file.   Social History Main Topics  . Smoking status: Never Smoker   . Smokeless tobacco: Never Used  . Alcohol Use: 3.5 oz/week    7 drink(s) per week  . Drug Use: No  . Sexual Activity: Yes    Partners: Female   Other Topics Concern  . Not on file   Social History Narrative   Married: 6 kids (blended family)   [employer]uz [employer] with work   Regular exercise: not recently   Caffeine use: coffee in the am    Objective:  Filed Vitals:   10/02/13 1507  BP: 124/72  Pulse: 85  Temp: 98 F (36.7 C)  Resp: 16     General appearance: alert, cooperative and appears stated age Ears: normal TM's and external ear canals both ears Throat: lips, mucosa, and tongue normal; teeth and gums normal Neck: no adenopathy, no carotid bruit, supple, symmetrical, trachea midline  and thyroid not enlarged, symmetric, no tenderness/mass/nodules Back: symmetric, no curvature. ROM normal. No CVA tenderness. Lungs: clear to auscultation bilaterally Heart: regular rate and rhythm, S1, S2 normal, no murmur, click, rub or gallop Abdomen: soft, non-tender; bowel sounds normal; no masses,  no organomegaly Pulses: 2+ and symmetric Skin: Skin color, texture, turgor normal. No rashes or lesions Lymph nodes: Cervical, supraclavicular, and axillary nodes normal.  Assessment and Plan:  Hypotestosteronism Free testosterone level was normal.  No supplements advised.   Irritable bowel syndrome symptoms have improved. He is no longer using bentyl   Strain, back MSK and  neuro exams are normal.   Prednisone taper,  vicodin prn .   Generalized anxiety disorder Discussed tapering off of citalopram over several weeks   A total of 40 minutes was spent with patient more than half of which was spent in counseling, hin on back pain m reviewing records from other providers and coordination of care.  Updated Medication List Outpatient Encounter Prescriptions as of 10/02/2013  Medication Sig  . acetaminophen (TYLENOL) 325 MG tablet Take 650 mg by mouth every 6 (six) hours as needed for pain.  Marland Kitchen escitalopram (LEXAPRO) 20 MG tablet TAKE 1 TABLET BY MOUTH DAILY.  . ciprofloxacin (CIPRO) 500 MG tablet Take 1 tablet (500 mg total) by mouth 2 (two) times daily.  Marland Kitchen dicyclomine (BENTYL) 20 MG tablet TAKE 1 TABLET (20 MG TOTAL) BY MOUTH EVERY 6 (SIX) HOURS.  . HYDROcodone-acetaminophen (NORCO/VICODIN) 5-325 MG per tablet Take 1 tablet by mouth every 6 (six) hours as needed for moderate pain.  . predniSONE (STERAPRED UNI-PAK) 10 MG tablet 6 tablets on day 1, decrease by tablet daily until gone  . promethazine (PHENERGAN) 25 MG tablet Take 1 tablet (25 mg total) by mouth every 8 (eight) hours as needed for nausea or vomiting.  . [DISCONTINUED] escitalopram (LEXAPRO) 20 MG tablet TAKE 1 TABLET BY MOUTH DAILY.  . [DISCONTINUED] escitalopram (LEXAPRO) 20 MG tablet TAKE 1 TABLET BY MOUTH EVERY DAY AND NEEDS APPT FOR MORE REFILLS     No orders of the defined types were placed in this encounter.    No Follow-up on file.

## 2013-10-02 NOTE — Patient Instructions (Addendum)
You can taper off of your lexapro  Over two to three weeks using the following schedule  1/2 tablet daily for 7 days,  Then 1/2 tablet every other day for 14 days,  Then stop   1/2 tablet every 3 days for the 3rd week is optional   For your back :  Your strained your muscles:   Prednisone taper for 6 days.  Suspend the advil while you are on the prednisone .    Ok to use tylenol up to 3000 mg  Of acetominophen daily  vicodin for extreme pain    Do not schedule labs before August 1 bc of the prednisone use   Take the promethazine and cipro with you on your vacation for treatment of traveler's diarrhea,  Or for urinary tract infection

## 2013-10-02 NOTE — Progress Notes (Signed)
Pre-visit discussion using our clinic review tool. No additional management support is needed unless otherwise documented below in the visit note.  

## 2013-10-03 DIAGNOSIS — S39012A Strain of muscle, fascia and tendon of lower back, initial encounter: Secondary | ICD-10-CM | POA: Insufficient documentation

## 2013-10-03 NOTE — Assessment & Plan Note (Signed)
Discussed tapering off of citalopram over several weeks

## 2013-10-03 NOTE — Assessment & Plan Note (Signed)
Free testosterone level was normal.  No supplements advised.

## 2013-10-03 NOTE — Assessment & Plan Note (Addendum)
MSK and neuro exams are normal.   Prednisone taper,  vicodin prn .

## 2013-10-03 NOTE — Assessment & Plan Note (Signed)
symptoms have improved. He is no longer using bentyl

## 2014-01-07 ENCOUNTER — Ambulatory Visit (INDEPENDENT_AMBULATORY_CARE_PROVIDER_SITE_OTHER): Payer: Commercial Indemnity | Admitting: Internal Medicine

## 2014-01-07 ENCOUNTER — Encounter: Payer: Self-pay | Admitting: Internal Medicine

## 2014-01-07 VITALS — BP 122/78 | HR 84 | Temp 98.5°F | Resp 16 | Ht 72.0 in | Wt 219.8 lb

## 2014-01-07 DIAGNOSIS — Z0000 Encounter for general adult medical examination without abnormal findings: Secondary | ICD-10-CM

## 2014-01-07 DIAGNOSIS — F411 Generalized anxiety disorder: Secondary | ICD-10-CM

## 2014-01-07 DIAGNOSIS — IMO0002 Reserved for concepts with insufficient information to code with codable children: Secondary | ICD-10-CM

## 2014-01-07 DIAGNOSIS — E663 Overweight: Secondary | ICD-10-CM

## 2014-01-07 DIAGNOSIS — K589 Irritable bowel syndrome without diarrhea: Secondary | ICD-10-CM

## 2014-01-07 DIAGNOSIS — S39012S Strain of muscle, fascia and tendon of lower back, sequela: Secondary | ICD-10-CM

## 2014-01-07 DIAGNOSIS — Z23 Encounter for immunization: Secondary | ICD-10-CM

## 2014-01-07 MED ORDER — PAROXETINE HCL ER 12.5 MG PO TB24: 12.5000 mg | ORAL_TABLET | Freq: Every day | ORAL | Status: DC

## 2014-01-07 NOTE — Progress Notes (Signed)
Pre-visit discussion using our clinic review tool. No additional management support is needed unless otherwise documented below in the visit note.  

## 2014-01-07 NOTE — Progress Notes (Signed)
Patient ID: Travis Bennett, male   DOB: [birth year], 48 y.o.   MRN: [id number]  The patient is here for his annualphysical examination and management of other chronic and acute problems.  Tried to taper off lexapro after last visit and apparently the stress of a new family, new wife  and a new job was overwhelming.  He has gained weight and is not sleeping well. Wants to lose weight but has no energy and can't find a time to devote to exercise. His new wife is a night owl,  Works untl very late with very little sleep.  Has lots of energy,  He is NOT. Lots of anxiety,  Finds an escape mowing the lawn   IBS discussion.  Wants to take a low dose of immodium on a regular basis to avoid diarrhea. His IBS is aggravated by certain foods which he knows to avoid. Tried the dicyclomine but had difficutly keeping up with it,   .     The risk factors are reflected in the social history.  The roster of all physicians providing medical care to patient - is listed in the Snapshot section of the chart.  Activities of daily living:  The patient is 100% independent in all ADLs: dressing, toileting, feeding as well as independent mobility  Home safety : The patient has smoke detectors in the home. He wears seatbelts.  There are no firearms at home. There is no violence in the home.   There is no risks for hepatitis, STDs or HIV. There is no   history of blood transfusion. There is no travel history to infectious disease endemic areas of the world.  The patient has seen their dentist in the last six month and  their eye doctor in the last year.  They do not  have excessive sun exposure. They have seen a dermatoloigist in the last year. Discussed the need for sun protection: hats, long sleeves and use of sunscreen if there is significant sun exposure.   Diet: the importance of a healthy diet is discussed. They do have a healthy diet.  The benefits of regular aerobic exercise were discussed. He is not  exercising. . Depression screen: there are no signs or vegative symptoms of depression- irritability, change in appetite, anhedonia, sadness/tearfullness.  The following portions of the patient's history were reviewed and updated as appropriate: allergies, current medications, past family history, past medical history,  past surgical history, past social history  and problem list.  Visual acuity was not assessed per patient preference since he has regular follow up with his ophthalmologist. Hearing and body mass index were assessed and reviewed.   During the course of the visit the patient was educated and counseled about appropriate screening and preventive services including :  nutrition counseling, colorectal cancer screening, and recommended immunizations.  Objective:  BP 122/78  Pulse 84  Temp(Src) 98.5 F (36.9 C) (Oral)  Resp 16  Ht 6' (1.829 m)  Wt 219 lb 12 oz (99.678 kg)  BMI 29.80 kg/m2  SpO2 96%  General Appearance:    Alert, cooperative, no distress, appears stated age  Head:    Normocephalic, without obvious abnormality, atraumatic  Eyes:    PERRL, conjunctiva/corneas clear, EOM's intact, fundi    benign, both eyes       Ears:    Normal TM's and external ear canals, both ears  Nose:   Nares normal, septum midline, mucosa normal, no drainage   or sinus tenderness  Throat:   Lips,  mucosa, and tongue normal; teeth and gums normal  Neck:   Supple, symmetrical, trachea midline, no adenopathy;       thyroid:  No enlargement/tenderness/nodules; no carotid   bruit or JVD  Back:     Symmetric, no curvature, ROM normal, no CVA tenderness  Lungs:     Clear to auscultation bilaterally, respirations unlabored  Chest wall:    No tenderness or deformity  Heart:    Regular rate and rhythm, S1 and S2 normal, no murmur, rub   or gallop  Abdomen:     Soft, non-tender, bowel sounds active all four quadrants,    no masses, no organomegaly     Extremities:   Extremities normal,  atraumatic, no cyanosis or edema  Pulses:   2+ and symmetric all extremities  Skin:   Skin color, texture, turgor normal, no rashes or lesions  Lymph nodes:   Cervical, supraclavicular, and axillary nodes normal  Neurologic:   CNII-XII intact. Normal strength, sensation and reflexes      throughout     Assessment and plan:  Strain, back Now resolved.   Irritable bowel syndrome Advised to resume dicyclomine tid and ok to use daily immodium. Discussed change in SSRI therapy to paxil.   Generalized anxiety disorder Discussed symptoms, prior therapy, pharm and non pharm ways to maaged,  Trial of paxil.   Overweight (BMI 25.0-29.9) I have addressed  BMI and recommended wt loss of 10% of body weigh over the next 6 months using a low glycemic index diet and regular exercise a minimum of 5 days per week.    Encounter for preventive health examination Annual wellness  exam was done as well as a comprehensive physical exam and management of acute and chronic conditions .  During the course of the visit the patient was educated and counseled about appropriate screening and preventive services including : fall prevention , diabetes screening, nutrition counseling, colorectal cancer screening, and recommended immunizations.  Printed recommendations for health maintenance screenings was given.    Updated Medication List Outpatient Encounter Prescriptions as of 01/07/2014  Medication Sig  . acetaminophen (TYLENOL) 325 MG tablet Take 650 mg by mouth every 6 (six) hours as needed for pain.  Marland Kitchen PARoxetine (PAXIL CR) 12.5 MG 24 hr tablet Take 1 tablet (12.5 mg total) by mouth daily.  . [DISCONTINUED] ciprofloxacin (CIPRO) 500 MG tablet Take 1 tablet (500 mg total) by mouth 2 (two) times daily.  . [DISCONTINUED] dicyclomine (BENTYL) 20 MG tablet TAKE 1 TABLET (20 MG TOTAL) BY MOUTH EVERY 6 (SIX) HOURS.  . [DISCONTINUED] escitalopram (LEXAPRO) 20 MG tablet TAKE 1 TABLET BY MOUTH DAILY.  . [DISCONTINUED]  HYDROcodone-acetaminophen (NORCO/VICODIN) 5-325 MG per tablet Take 1 tablet by mouth every 6 (six) hours as needed for moderate pain.  . [DISCONTINUED] predniSONE (STERAPRED UNI-PAK) 10 MG tablet 6 tablets on day 1, decrease by tablet daily until gone  . [DISCONTINUED] promethazine (PHENERGAN) 25 MG tablet Take 1 tablet (25 mg total) by mouth every 8 (eight) hours as needed for nausea or vomiting.

## 2014-01-07 NOTE — Patient Instructions (Addendum)
I want you to lose 15 lbs over the next 6 months  This is  my version of a  "Low GI"  Diet:  It will still lower your blood sugars and allow you to lose 4 to 8  lbs  per month if you follow it carefully.  Your goal with exercise is a minimum of 30 minutes of aerobic exercise 5 days per week (Walking does not count once it becomes easy!)      All of the foods can be found at grocery stores and in bulk at [employer].  The Atkins protein bars and shakes are available in more varieties at [employer], [employer] and [place].     7 AM Breakfast:  Choose from the following:  Low carbohydrate Protein  Shakes (I recommend the EAS AdvantEdge "Carb Control" shakes  Or the low carb shakes by Atkins.    2.5 carbs   Arnold's "Sandwhich Thin"toasted  w/ peanut butter (no jelly: about 20 net carbs  "Bagel Thin" with cream cheese and salmon: about 20 carbs   a scrambled egg/bacon/cheese burrito made with Mission's "carb balance" whole wheat tortilla  (about 10 net carbs )  A slice of home made fritatta (egg based dish without a crust:  google it)    Avoid cereal and bananas, oatmeal and cream of wheat and grits. They are loaded with carbohydrates!   10 AM: high protein snack  Protein bar by Atkins (the snack size, under 200 cal, usually < 6 net carbs).    A stick of cheese:  Around 1 carb,  100 cal     Dannon Light n Fit [place] Yogurt  (80 cal, 8 carbs)  Other so called "protein bars" and Greek yogurts tend to be loaded with carbohydrates.  Remember, in food advertising, the word "energy" is synonymous for " carbohydrate."  Lunch:   A Sandwich using the bread choices listed, Can use any  Eggs,  lunchmeat, grilled meat or canned tuna), avocado, regular mayo/mustard  and cheese.  A Salad using blue cheese, ranch,  Goddess or vinagrette,  No croutons or "confetti" and no "candied nuts" but regular nuts OK.   No pretzels or chips.  Pickles, pork rinds (yes , pork rinds) and miniature sweet peppers are a good low  carb alternative that provide a "crunch"  The bread is the only source of carbohydrate in a sandwich and  can be decreased by trying some of these alternatives to traditional loaf bread  Joseph's makes a pita bread and a flat bread that are 50 cal and 4 net carbs available at [employer] and [place].  This can be toasted to use with hummous as well  Toufayan makes a low carb flatbread that's 100 cal and 9 net carbs available at [employer] and [employer]J's makes 2 sizes of  Low carb whole wheat tortilla  (The large one is 210 cal and 6 net carbs) Avoid "Low fat dressings, as well as Barry Brunner and Lamar dressings They are loaded with sugar!   3 PM/ Mid day  Snack:  Consider  1 ounce of  almonds, walnuts, pistachios, pecans, peanuts,  Macadamia nuts or a nut medley.  Avoid "granola"; the dried cranberries and raisins are loaded with carbohydrates. Mixed nuts as long as there are no raisins,  cranberries or dried fruit.    Try the prosciutto/mozzarella cheese sticks by Fiorruci  In deli /backery section   High protein   To avoid overindulging in snacks: Try  drinking a glass of unsweeted almond/coconut milk  Or a cup of coffee with your Atkins chocolate bar t o keep you from having 3!!!        6 PM  Dinner:     Meat/fowl/fish with a green salad, and either broccoli, cauliflower, green beans, spinach, brussel sprouts or  Lima beans. DO NOT BREAD THE PROTEIN!!      There is a low carb pasta by Dreamfield's that is acceptable and tastes great: only 5 digestible carbs/serving.( All grocery stores but BJs carry it )  Try Hurley Cisco Angelo's chicken piccata or chicken or eggplant parm over low carb pasta.(Lowes and BJs)   Marjory Lies Sanchez's "Carnitas" (pulled pork, no sauce,  0 carbs) or his beef pot roast to make a dinner burrito (at [employer]J's)  Pesto over low carb pasta (bj's sells a good quality pesto in the center refrigerated section of the deli   Try satueeing  Cheral Marker with mushroooms  Whole wheat pasta is  still full of digestible carbs and  Not as low in glycemic index as Dreamfield's.   Brown rice is still rice,  So skip the rice and noodles if you eat [place] or [place] (or at least limit to 1/2 cup)  9 PM snack :   Breyer's "low carb" fudgsicle or  ice cream bar (Carb Smart line), or  Weight Watcher's ice cream bar , or another "no sugar added" ice cream;  a serving of fresh berries/cherries with whipped cream   Cheese or DANNON'S LlGHT N FIT GREEK YOGURT or the Oikos greek yogurt   8 ounces of [place] unsweetened almond/cococunut milk    Avoid bananas, pineapple, grapes  and watermelon on a regular basis because they are high in sugar.  THINK OF THEM AS DESSERT  Remember that snack Substitutions should be less than 10 NET carbs per serving and meals < 20 carbs. Remember to subtract fiber grams to get the "net carbs."  Health Maintenance A healthy lifestyle and preventative care can promote health and wellness.  Maintain regular health, dental, and eye exams.  Eat a healthy diet. Foods like vegetables, fruits, whole grains, low-fat dairy products, and lean protein foods contain the nutrients you need and are low in calories. Decrease your intake of foods high in solid fats, added sugars, and salt. Get information about a proper diet from your health care provider, if necessary.  Regular physical exercise is one of the most important things you can do for your health. Most adults should get at least 150 minutes of moderate-intensity exercise (any activity that increases your heart rate and causes you to sweat) each week. In addition, most adults need muscle-strengthening exercises on 2 or more days a week.   Maintain a healthy weight. The body mass index (BMI) is a screening tool to identify possible weight problems. It provides an estimate of body fat based on height and weight. Your health care provider can find your BMI and can help you achieve or maintain a healthy weight. For males 20  years and older:  A BMI below 18.5 is considered underweight.  A BMI of 18.5 to 24.9 is normal.  A BMI of 25 to 29.9 is considered overweight.  A BMI of 30 and above is considered obese.  Maintain normal blood lipids and cholesterol by exercising and minimizing your intake of saturated fat. Eat a balanced diet with plenty of fruits and vegetables. Blood tests for lipids and cholesterol should begin at age 48 and be  repeated every 5 years. If your lipid or cholesterol levels are high, you are over age 58, or you are at high risk for heart disease, you may need your cholesterol levels checked more frequently.Ongoing high lipid and cholesterol levels should be treated with medicines if diet and exercise are not working.  If you smoke, find out from your health care provider how to quit. If you do not use tobacco, do not start.  Lung cancer screening is recommended for adults aged 74-80 years who are at high risk for developing lung cancer because of a history of smoking. A yearly low-dose CT scan of the lungs is recommended for people who have at least a 30-pack-year history of smoking and are current smokers or have quit within the past 15 years. A pack year of smoking is smoking an average of 1 pack of cigarettes a day for 1 year (for example, a 30-pack-year history of smoking could mean smoking 1 pack a day for 30 years or 2 packs a day for 15 years). Yearly screening should continue until the smoker has stopped smoking for at least 15 years. Yearly screening should be stopped for people who develop a health problem that would prevent them from having lung cancer treatment.  If you choose to drink alcohol, do not have more than 2 drinks per day. One drink is considered to be 12 oz (360 mL) of beer, 5 oz (150 mL) of wine, or 1.5 oz (45 mL) of liquor.  Avoid the use of street drugs. Do not share needles with anyone. Ask for help if you need support or instructions about stopping the use of  drugs.  High blood pressure causes heart disease and increases the risk of stroke. Blood pressure should be checked at least every 1-2 years. Ongoing high blood pressure should be treated with medicines if weight loss and exercise are not effective.  If you are 77-37 years old, ask your health care provider if you should take aspirin to prevent heart disease.  Diabetes screening involves taking a blood sample to check your fasting blood sugar level. This should be done once every 3 years after age 4 if you are at a normal weight and without risk factors for diabetes. Testing should be considered at a younger age or be carried out more frequently if you are overweight and have at least 1 risk factor for diabetes.  Colorectal cancer can be detected and often prevented. Most routine colorectal cancer screening begins at the age of 40 and continues through age 49. However, your health care provider may recommend screening at an earlier age if you have risk factors for colon cancer. On a yearly basis, your health care provider may provide home test kits to check for hidden blood in the stool. A small camera at the end of a tube may be used to directly examine the colon (sigmoidoscopy or colonoscopy) to detect the earliest forms of colorectal cancer. Talk to your health care provider about this at age 38 when routine screening begins. A direct exam of the colon should be repeated every 5-10 years through age 31, unless early forms of precancerous polyps or small growths are found.  People who are at an increased risk for hepatitis B should be screened for this virus. You are considered at high risk for hepatitis B if:  You were born in a country where hepatitis B occurs often. Talk with your health care provider about which countries are considered high risk.  Your  parents were born in a high-risk country and you have not received a shot to protect against hepatitis B (hepatitis B vaccine).  You have HIV  or AIDS.  You use needles to inject street drugs.  You live with, or have sex with, someone who has hepatitis B.  You are a man who has sex with other men (MSM).  You get hemodialysis treatment.  You take certain medicines for conditions like cancer, organ transplantation, and autoimmune conditions.  Hepatitis C blood testing is recommended for all people born from [birth year] through 1963 and any individual with known risk factors for hepatitis C.  Healthy men should no longer receive prostate-specific antigen (PSA) blood tests as part of routine cancer screening. Talk to your health care provider about prostate cancer screening.  Testicular cancer screening is not recommended for adolescents or adult males who have no symptoms. Screening includes self-exam, a health care provider exam, and other screening tests. Consult with your health care provider about any symptoms you have or any concerns you have about testicular cancer.  Practice safe sex. Use condoms and avoid high-risk sexual practices to reduce the spread of sexually transmitted infections (STIs).  You should be screened for STIs, including gonorrhea and chlamydia if:  You are sexually active and are younger than 24 years.  You are older than 24 years, and your health care provider tells you that you are at risk for this type of infection.  Your sexual activity has changed since you were last screened, and you are at an increased risk for chlamydia or gonorrhea. Ask your health care provider if you are at risk.  If you are at risk of being infected with HIV, it is recommended that you take a prescription medicine daily to prevent HIV infection. This is called pre-exposure prophylaxis (PrEP). You are considered at risk if:  You are a man who has sex with other men (MSM).  You are a heterosexual man who is sexually active with multiple partners.  You take drugs by injection.  You are sexually active with a partner who has  HIV.  Talk with your health care provider about whether you are at high risk of being infected with HIV. If you choose to begin PrEP, you should first be tested for HIV. You should then be tested every 3 months for as long as you are taking PrEP.  Use sunscreen. Apply sunscreen liberally and repeatedly throughout the day. You should seek shade when your shadow is shorter than you. Protect yourself by wearing long sleeves, pants, a wide-brimmed hat, and sunglasses year round whenever you are outdoors.  Tell your health care provider of new moles or changes in moles, especially if there is a change in shape or color. Also, tell your health care provider if a mole is larger than the size of a pencil eraser.  A one-time screening for abdominal aortic aneurysm (AAA) and surgical repair of large AAAs by ultrasound is recommended for men aged 6-75 years who are current or former smokers.  Stay current with your vaccines (immunizations). Document Released: 10/02/2007 Document Revised: 04/10/2013 Document Reviewed: 08/31/2010 [facility] Patient Information 2015 [place], [place]. This information is not intended to replace advice given to you by your health care provider. Make sure you discuss any questions you have with your health care provider.

## 2014-01-07 NOTE — Assessment & Plan Note (Signed)
Now resolved.  

## 2014-01-09 DIAGNOSIS — E663 Overweight: Secondary | ICD-10-CM | POA: Insufficient documentation

## 2014-01-09 DIAGNOSIS — Z0000 Encounter for general adult medical examination without abnormal findings: Secondary | ICD-10-CM | POA: Insufficient documentation

## 2014-01-09 NOTE — Assessment & Plan Note (Signed)
I have addressed  BMI and recommended wt loss of 10% of body weigh over the next 6 months using a low glycemic index diet and regular exercise a minimum of 5 days per week.   

## 2014-01-09 NOTE — Assessment & Plan Note (Signed)
Discussed symptoms, prior therapy, pharm and non pharm ways to maaged,  Trial of paxil.

## 2014-01-09 NOTE — Assessment & Plan Note (Addendum)
Advised to resume dicyclomine tid and ok to use daily immodium. Discussed change in SSRI therapy to paxil.

## 2014-01-09 NOTE — Assessment & Plan Note (Signed)
Annual  wellness  exam was done as well as a comprehensive physical exam and management of acute and chronic conditions .  During the course of the visit the patient was educated and counseled about appropriate screening and preventive services including : fall prevention , diabetes screening, nutrition counseling, colorectal cancer screening, and recommended immunizations.  Printed recommendations for health maintenance screenings was given.  

## 2014-03-04 ENCOUNTER — Other Ambulatory Visit: Payer: Self-pay | Admitting: Internal Medicine

## 2014-06-10 ENCOUNTER — Other Ambulatory Visit: Payer: Self-pay | Admitting: Internal Medicine

## 2014-06-10 NOTE — Telephone Encounter (Signed)
Last OV 9/15 ok to fill?

## 2014-06-11 NOTE — Telephone Encounter (Signed)
Medication has been refilled and sent to pharmacy  

## 2014-09-13 ENCOUNTER — Other Ambulatory Visit: Payer: Self-pay | Admitting: Internal Medicine

## 2014-09-13 NOTE — Telephone Encounter (Signed)
Last visit 9.21.15, next visit 9.23.16. Please advise.

## 2014-09-13 NOTE — Telephone Encounter (Signed)
Ok to refill,  Refill sent  

## 2015-01-10 ENCOUNTER — Encounter: Payer: Commercial Indemnity | Admitting: Internal Medicine

## 2015-01-27 ENCOUNTER — Ambulatory Visit (INDEPENDENT_AMBULATORY_CARE_PROVIDER_SITE_OTHER): Payer: 59 | Admitting: Family Medicine

## 2015-01-27 ENCOUNTER — Encounter: Payer: Self-pay | Admitting: Family Medicine

## 2015-01-27 VITALS — BP 116/78 | HR 78 | Temp 98.4°F | Ht 72.0 in | Wt 213.1 lb

## 2015-01-27 DIAGNOSIS — R197 Diarrhea, unspecified: Secondary | ICD-10-CM

## 2015-01-27 DIAGNOSIS — J3489 Other specified disorders of nose and nasal sinuses: Secondary | ICD-10-CM

## 2015-01-27 NOTE — Progress Notes (Signed)
Subjective:  Patient ID: Travis Bennett, male    DOB: [birth year]  Age: 49 y.o. MRN: [id number]  CC: Diarrhea  HPI:  49 year old male presents to clinic today with complaints of diarrhea.  Patient reports that he developed fever and nausea and diarrhea on Saturday. He reports that his fever resolved by Sunday but diarrhea persisted. No sick contacts. He has been taking Imodium and Pepto with some relief in his constipation. Has not taken any medications today. His symptoms appear to be resolving. No associated vomiting.  No exacerbating factors. Additionally, patient reports sinus pressure.   Social Hx   Social History   Social History  . Marital Status: Single    Spouse Name: N/A  . Number of Children: N/A  . Years of Education: N/A   Social History Main Topics  . Smoking status: Never Smoker   . Smokeless tobacco: Never Used  . Alcohol Use: 3.5 oz/week    7 drink(s) per week  . Drug Use: No  . Sexual Activity:    Partners: Female   Other Topics Concern  . None   Social History Narrative   Married: 6 kids (blended family)   Luz Lex with work   Regular exercise: not recently   Caffeine use: coffee in the am   Review of Systems  Constitutional: Positive for fever.  Gastrointestinal: Positive for nausea and diarrhea. Negative for vomiting.   Objective:  BP 116/78 mmHg  Pulse 78  Temp(Src) 98.4 F (36.9 C) (Oral)  Ht 6' (1.829 m)  Wt 213 lb 2 oz (96.673 kg)  BMI 28.90 kg/m2  SpO2 97%  BP/Weight 01/27/2015 01/07/2014 6/59/9357  Systolic BP 017 793 903  Diastolic BP 78 78 72  Wt. (Lbs) 213.13 219.75 218.25  BMI 28.9 29.8 29.59   Physical Exam  Constitutional: He is oriented to person, place, and time. He appears well-developed and well-nourished. No distress.  HENT:  Dry mucous membranes.  Cardiovascular: Normal rate and regular rhythm.   No murmur heard. Pulmonary/Chest: Effort normal and breath sounds normal. He has no wheezes. He has no rales. He exhibits  no tenderness.  Abdominal: Soft. He exhibits no distension. There is no tenderness. There is no rebound and no guarding.  Neurological: He is alert and oriented to person, place, and time.  Skin: Skin is warm and dry.  Psychiatric: He has a normal mood and affect.  Vitals reviewed.  Lab Results  Component Value Date   WBC 5.7 11/28/2012   HGB 15.9 11/28/2012   HCT 47.5 11/28/2012   PLT 186.0 11/28/2012   GLUCOSE 100* 11/28/2012   CHOL 194 11/28/2012   TRIG 160.0* 11/28/2012   HDL 43.30 11/28/2012   LDLCALC 119* 11/28/2012   ALT 25 11/28/2012   AST 23 11/28/2012   NA 140 11/28/2012   K 4.8 11/28/2012   CL 104 11/28/2012   CREATININE 1.1 11/28/2012   BUN 12 11/28/2012   CO2 28 11/28/2012   TSH 1.33 03/14/2013   PSA 0.75 03/14/2013   HGBA1C 5.1 03/14/2013   Assessment & Plan:   Problem List Items Addressed This Visit    Sinus pressure    Patient concerned about sinus infection. Exam unremarkable. Advised conservative treatment with Nettie pot, Flonase, and Sudafed. No indication for antibiotics at this time.      Diarrhea - Primary    Likely viral in origin. Patient improving. Advised cessation of over-the-counter treatments. Advised starting soft, bland diet with slow titration to normal diet.  Meds ordered this encounter  Medications  . MULTIPLE VITAMIN PO    Sig: Take by mouth.    Follow-up: PRN  Thersa Salt, DO

## 2015-01-27 NOTE — Progress Notes (Signed)
Pre visit review using our clinic review tool, if applicable. No additional management support is needed unless otherwise documented below in the visit note. 

## 2015-01-27 NOTE — Patient Instructions (Signed)
Follow up with Dr. Derrel Nip as scheduled.   Diarrhea Diarrhea is frequent loose and watery bowel movements. It can cause you to feel weak and dehydrated. Dehydration can cause you to become tired and thirsty, have a dry mouth, and have decreased urination that often is dark yellow. Diarrhea is a sign of another problem, most often an infection that will not last long. In most cases, diarrhea typically lasts 2-3 days. However, it can last longer if it is a sign of something more serious. It is important to treat your diarrhea as directed by your caregiver to lessen or prevent future episodes of diarrhea. CAUSES  Some common causes include:  Gastrointestinal infections caused by viruses, bacteria, or parasites.  Food poisoning or food allergies.  Certain medicines, such as antibiotics, chemotherapy, and laxatives.  Artificial sweeteners and fructose.  Digestive disorders. HOME CARE INSTRUCTIONS  Ensure adequate fluid intake (hydration): Have 1 cup (8 oz) of fluid for each diarrhea episode. Avoid fluids that contain simple sugars or sports drinks, fruit juices, whole milk products, and sodas. Your urine should be clear or pale yellow if you are drinking enough fluids. Hydrate with an oral rehydration solution that you can purchase at pharmacies, retail stores, and online. You can prepare an oral rehydration solution at home by mixing the following ingredients together:   - tsp table salt.   tsp baking soda.   tsp salt substitute containing potassium chloride.  1  tablespoons sugar.  1 L (34 oz) of water.  Certain foods and beverages may increase the speed at which food moves through the gastrointestinal (GI) tract. These foods and beverages should be avoided and include:  Caffeinated and alcoholic beverages.  High-fiber foods, such as raw fruits and vegetables, nuts, seeds, and whole grain breads and cereals.  Foods and beverages sweetened with sugar alcohols, such as xylitol,  sorbitol, and mannitol.  Some foods may be well tolerated and may help thicken stool including:  Starchy foods, such as rice, toast, pasta, low-sugar cereal, oatmeal, grits, baked potatoes, crackers, and bagels.  Bananas.  Applesauce.  Add probiotic-rich foods to help increase healthy bacteria in the GI tract, such as yogurt and fermented milk products.  Wash your hands well after each diarrhea episode.  Only take over-the-counter or prescription medicines as directed by your caregiver.  Take a warm bath to relieve any burning or pain from frequent diarrhea episodes. SEEK IMMEDIATE MEDICAL CARE IF:   You are unable to keep fluids down.  You have persistent vomiting.  You have blood in your stool, or your stools are black and tarry.  You do not urinate in 6-8 hours, or there is only a small amount of very dark urine.  You have abdominal pain that increases or localizes.  You have weakness, dizziness, confusion, or light-headedness.  You have a severe headache.  Your diarrhea gets worse or does not get better.  You have a fever or persistent symptoms for more than 2-3 days.  You have a fever and your symptoms suddenly get worse. MAKE SURE YOU:   Understand these instructions.  Will watch your condition.  Will get help right away if you are not doing well or get worse.   This information is not intended to replace advice given to you by your health care provider. Make sure you discuss any questions you have with your health care provider.   Document Released: 03/26/2002 Document Revised: 04/26/2014 Document Reviewed: 12/12/2011 Elsevier Interactive Patient Education [employer].

## 2015-01-27 NOTE — Assessment & Plan Note (Signed)
Patient concerned about sinus infection. Exam unremarkable. Advised conservative treatment with Nettie pot, Flonase, and Sudafed. No indication for antibiotics at this time.

## 2015-01-27 NOTE — Assessment & Plan Note (Signed)
Likely viral in origin. Patient improving. Advised cessation of over-the-counter treatments. Advised starting soft, bland diet with slow titration to normal diet.

## 2015-01-31 ENCOUNTER — Encounter: Payer: Self-pay | Admitting: Internal Medicine

## 2015-01-31 ENCOUNTER — Ambulatory Visit (INDEPENDENT_AMBULATORY_CARE_PROVIDER_SITE_OTHER): Payer: 59 | Admitting: Internal Medicine

## 2015-01-31 VITALS — BP 116/78 | HR 63 | Temp 98.3°F | Resp 12 | Ht 72.0 in | Wt 213.5 lb

## 2015-01-31 DIAGNOSIS — E785 Hyperlipidemia, unspecified: Secondary | ICD-10-CM

## 2015-01-31 DIAGNOSIS — Z113 Encounter for screening for infections with a predominantly sexual mode of transmission: Secondary | ICD-10-CM

## 2015-01-31 DIAGNOSIS — Z23 Encounter for immunization: Secondary | ICD-10-CM

## 2015-01-31 DIAGNOSIS — R5383 Other fatigue: Secondary | ICD-10-CM

## 2015-01-31 DIAGNOSIS — K580 Irritable bowel syndrome with diarrhea: Secondary | ICD-10-CM

## 2015-01-31 DIAGNOSIS — J3489 Other specified disorders of nose and nasal sinuses: Secondary | ICD-10-CM

## 2015-01-31 DIAGNOSIS — F411 Generalized anxiety disorder: Secondary | ICD-10-CM

## 2015-01-31 DIAGNOSIS — E663 Overweight: Secondary | ICD-10-CM | POA: Diagnosis not present

## 2015-01-31 DIAGNOSIS — Z0000 Encounter for general adult medical examination without abnormal findings: Secondary | ICD-10-CM

## 2015-01-31 DIAGNOSIS — R351 Nocturia: Secondary | ICD-10-CM | POA: Diagnosis not present

## 2015-01-31 LAB — POCT URINALYSIS DIPSTICK
BILIRUBIN UA: NEGATIVE
Glucose, UA: NEGATIVE
Leukocytes, UA: NEGATIVE
NITRITE UA: NEGATIVE
PH UA: 7
Protein, UA: NEGATIVE
RBC UA: NEGATIVE
SPEC GRAV UA: 1.01
Urobilinogen, UA: 0.2

## 2015-01-31 MED ORDER — ESCITALOPRAM OXALATE 10 MG PO TABS: 10.0000 mg | ORAL_TABLET | Freq: Every day | ORAL | Status: DC

## 2015-01-31 MED ORDER — OLOPATADINE HCL 0.2 % OP SOLN: 1.0000 [drp] | Freq: Every day | OPHTHALMIC | Status: DC

## 2015-01-31 NOTE — Patient Instructions (Addendum)
I recommend using [employer]'s sinus rinse once or twice daily to prevent sinus infections from trapped viruses .    We are resuming Lexapro  10 mg daily,  Start with 1/2 tablet daily after dinner    Health Maintenance, Male A healthy lifestyle and preventative care can promote health and wellness.  Maintain regular health, dental, and eye exams.  Eat a healthy diet. Foods like vegetables, fruits, whole grains, low-fat dairy products, and lean protein foods contain the nutrients you need and are low in calories. Decrease your intake of foods high in solid fats, added sugars, and salt. Get information about a proper diet from your health care provider, if necessary.  Regular physical exercise is one of the most important things you can do for your health. Most adults should get at least 150 minutes of moderate-intensity exercise (any activity that increases your heart rate and causes you to sweat) each week. In addition, most adults need muscle-strengthening exercises on 2 or more days a week.   Maintain a healthy weight. The body mass index (BMI) is a screening tool to identify possible weight problems. It provides an estimate of body fat based on height and weight. Your health care provider can find your BMI and can help you achieve or maintain a healthy weight. For males 20 years and older:  A BMI below 18.5 is considered underweight.  A BMI of 18.5 to 24.9 is normal.  A BMI of 25 to 29.9 is considered overweight.  A BMI of 30 and above is considered obese.  Maintain normal blood lipids and cholesterol by exercising and minimizing your intake of saturated fat. Eat a balanced diet with plenty of fruits and vegetables. Blood tests for lipids and cholesterol should begin at age 67 and be repeated every 5 years. If your lipid or cholesterol levels are high, you are over age 86, or you are at high risk for heart disease, you may need your cholesterol levels checked more frequently.Ongoing high  lipid and cholesterol levels should be treated with medicines if diet and exercise are not working.  If you smoke, find out from your health care provider how to quit. If you do not use tobacco, do not start.  Lung cancer screening is recommended for adults aged 28-80 years who are at high risk for developing lung cancer because of a history of smoking. A yearly low-dose CT scan of the lungs is recommended for people who have at least a 30-pack-year history of smoking and are current smokers or have quit within the past 15 years. A pack year of smoking is smoking an average of 1 pack of cigarettes a day for 1 year (for example, a 30-pack-year history of smoking could mean smoking 1 pack a day for 30 years or 2 packs a day for 15 years). Yearly screening should continue until the smoker has stopped smoking for at least 15 years. Yearly screening should be stopped for people who develop a health problem that would prevent them from having lung cancer treatment.  If you choose to drink alcohol, do not have more than 2 drinks per day. One drink is considered to be 12 oz (360 mL) of beer, 5 oz (150 mL) of wine, or 1.5 oz (45 mL) of liquor.  Avoid the use of street drugs. Do not share needles with anyone. Ask for help if you need support or instructions about stopping the use of drugs.  High blood pressure causes heart disease and increases the risk of  stroke. High blood pressure is more likely to develop in:  People who have blood pressure in the end of the normal range (100-139/85-89 mm Hg).  People who are overweight or obese.  People who are African American.  If you are 3-41 years of age, have your blood pressure checked every 3-5 years. If you are 28 years of age or older, have your blood pressure checked every year. You should have your blood pressure measured twice--once when you are at a hospital or clinic, and once when you are not at a hospital or clinic. Record the average of the two  measurements. To check your blood pressure when you are not at a hospital or clinic, you can use:  An automated blood pressure machine at a pharmacy.  A home blood pressure monitor.  If you are 39-74 years old, ask your health care provider if you should take aspirin to prevent heart disease.  Diabetes screening involves taking a blood sample to check your fasting blood sugar level. This should be done once every 3 years after age 73 if you are at a normal weight and without risk factors for diabetes. Testing should be considered at a younger age or be carried out more frequently if you are overweight and have at least 1 risk factor for diabetes.  Colorectal cancer can be detected and often prevented. Most routine colorectal cancer screening begins at the age of 41 and continues through age 60. However, your health care provider may recommend screening at an earlier age if you have risk factors for colon cancer. On a yearly basis, your health care provider may provide home test kits to check for hidden blood in the stool. A small camera at the end of a tube may be used to directly examine the colon (sigmoidoscopy or colonoscopy) to detect the earliest forms of colorectal cancer. Talk to your health care provider about this at age 31 when routine screening begins. A direct exam of the colon should be repeated every 5-10 years through age 46, unless early forms of precancerous polyps or small growths are found.  People who are at an increased risk for hepatitis B should be screened for this virus. You are considered at high risk for hepatitis B if:  You were born in a country where hepatitis B occurs often. Talk with your health care provider about which countries are considered high risk.  Your parents were born in a high-risk country and you have not received a shot to protect against hepatitis B (hepatitis B vaccine).  You have HIV or AIDS.  You use needles to inject street drugs.  You live  with, or have sex with, someone who has hepatitis B.  You are a man who has sex with other men (MSM).  You get hemodialysis treatment.  You take certain medicines for conditions like cancer, organ transplantation, and autoimmune conditions.  Hepatitis C blood testing is recommended for all people born from [birth year] through 1963 and any individual with known risk factors for hepatitis C.  Healthy men should no longer receive prostate-specific antigen (PSA) blood tests as part of routine cancer screening. Talk to your health care provider about prostate cancer screening.  Testicular cancer screening is not recommended for adolescents or adult males who have no symptoms. Screening includes self-exam, a health care provider exam, and other screening tests. Consult with your health care provider about any symptoms you have or any concerns you have about testicular cancer.  Practice safe sex. Use  condoms and avoid high-risk sexual practices to reduce the spread of sexually transmitted infections (STIs).  You should be screened for STIs, including gonorrhea and chlamydia if:  You are sexually active and are younger than 24 years.  You are older than 24 years, and your health care provider tells you that you are at risk for this type of infection.  Your sexual activity has changed since you were last screened, and you are at an increased risk for chlamydia or gonorrhea. Ask your health care provider if you are at risk.  If you are at risk of being infected with HIV, it is recommended that you take a prescription medicine daily to prevent HIV infection. This is called pre-exposure prophylaxis (PrEP). You are considered at risk if:  You are a man who has sex with other men (MSM).  You are a heterosexual man who is sexually active with multiple partners.  You take drugs by injection.  You are sexually active with a partner who has HIV.  Talk with your health care provider about whether you are at  high risk of being infected with HIV. If you choose to begin PrEP, you should first be tested for HIV. You should then be tested every 3 months for as long as you are taking PrEP.  Use sunscreen. Apply sunscreen liberally and repeatedly throughout the day. You should seek shade when your shadow is shorter than you. Protect yourself by wearing long sleeves, pants, a wide-brimmed hat, and sunglasses year round whenever you are outdoors.  Tell your health care provider of new moles or changes in moles, especially if there is a change in shape or color. Also, tell your health care provider if a mole is larger than the size of a pencil eraser.  A one-time screening for abdominal aortic aneurysm (AAA) and surgical repair of large AAAs by ultrasound is recommended for men aged 83-75 years who are current or former smokers.  Stay current with your vaccines (immunizations).   This information is not intended to replace advice given to you by your health care provider. Make sure you discuss any questions you have with your health care provider.   Document Released: 10/02/2007 Document Revised: 04/26/2014 Document Reviewed: 08/31/2010 Elsevier Interactive Patient Education [employer].

## 2015-01-31 NOTE — Progress Notes (Signed)
Pre-visit discussion using our clinic review tool. No additional management support is needed unless otherwise documented below in the visit note.  

## 2015-01-31 NOTE — Progress Notes (Signed)
Patient ID: Travis Bennett, male    DOB: Nov 26, 1965  Age: 49 y.o. MRN: [id number]  The patient is here for annual  wellness examination and management of other chronic and acute problems.   The risk factors are reflected in the social history.  The roster of all physicians providing medical care to patient - is listed in the Snapshot section of the chart. Home safety : The patient has smoke detectors in the home. They wear seatbelts.  There are no firearms at home. There is no violence in the home.   There is no risks for hepatitis, STDs or HIV. There is no   history of blood transfusion. They have no travel history to infectious disease endemic areas of the world.  The patient has seen their dentist in the last six month. They have seen their eye doctor in the last year.They do not  have excessive sun exposure. Discussed the need for sun protection: hats, long sleeves and use of sunscreen if there is significant sun exposure.   Diet: the importance of a healthy diet is discussed. They do have a healthy diet.  The benefits of regular aerobic exercise were discussed. She walks 4 times per week ,  20 minutes.   Depression screen: there are no signs or vegative symptoms of depression- irritability, change in appetite, anhedonia, sadness/tearfullness.  The following portions of the patient's history were reviewed and updated as appropriate: allergies, current medications, past family history, past medical history,  past surgical history, past social history  and problem list.  Visual acuity was not assessed per patient preference since she has regular follow up with her ophthalmologist. Hearing and body mass index were assessed and reviewed.   During the course of the visit the patient was educated and counseled about appropriate screening and preventive services including : fall prevention , diabetes screening, nutrition counseling, colorectal cancer screening, and recommended immunizations.     CC: The primary encounter diagnosis was Screen for STD (sexually transmitted disease). Diagnoses of Other fatigue, Hyperlipidemia, Nocturia, Overweight (BMI 25.0-29.9), Generalized anxiety disorder, Sinus pressure, Irritable bowel syndrome with diarrhea, and Encounter for preventive health examination were also pertinent to this visit.  Recovered from stomach flu with fever to 101.  3 day history , was seen by Dr Lacinda Axon on Oct 10 .  No meds prescribed.  sinus exam was normal at that time as well.  typiclaly  Gets 2 episodes per year in the late fall and winter  Back is fine .  Bowels much better with  Use of Paxil for IBS D .  Stopped 4 months ago due to blurring vision and change in personality.  Using immodium once daily .  Lots of stress due to sale of house new family of 17. And negotiations with  Ex wife.   No prior trial of wellbutrin    History Travis Bennett has a past medical history of Blood in stool; Chicken pox; Depression; Chronic headaches; and Irritable bowel syndrome.   He has past surgical history that includes Tonsillectomy and adenoidectomy (1970); Anal fistulectomy (1996); and Anal fistulectomy (N/A, 1995).   His family history includes Alcohol abuse in his maternal grandmother; Alzheimer's disease in his paternal grandmother; Cancer in his father, maternal grandmother, and paternal grandfather; Early death in his maternal aunt; Heart disease in his paternal grandfather; Heart disease (age of onset: 1) in his maternal grandfather; Hyperlipidemia in his mother.He reports that he has never smoked. He has never used smokeless tobacco. He reports that he  drinks about 3.5 oz of alcohol per week. He reports that he does not use illicit drugs.  Outpatient Prescriptions Prior to Visit  Medication Sig Dispense Refill  . acetaminophen (TYLENOL) 325 MG tablet Take 650 mg by mouth every 6 (six) hours as needed for pain.    Marland Kitchen MULTIPLE VITAMIN PO Take by mouth.    Marland Kitchen PARoxetine (PAXIL-CR) 12.5 MG  24 hr tablet TAKE 1 TABLET (12.5 MG TOTAL) BY MOUTH DAILY. (Patient not taking: Reported on 01/27/2015) 30 tablet 2  . PARoxetine (PAXIL-CR) 12.5 MG 24 hr tablet TAKE 1 TABLET BY MOUTH EVERY DAY (Patient not taking: Reported on 01/27/2015) 30 tablet 2   No facility-administered medications prior to visit.    Review of Systems   Patient denies headache, fevers, malaise, unintentional weight loss, skin rash, eye pain, sinus congestion and sinus pain, sore throat, dysphagia,  hemoptysis , cough, dyspnea, wheezing, chest pain, palpitations, orthopnea, edema, abdominal pain, nausea, melena, diarrhea, constipation, flank pain, dysuria, hematuria, urinary  Frequency, nocturia, numbness, tingling, seizures,  Focal weakness, Loss of consciousness,  Tremor, insomnia, depression, anxiety, and suicidal ideation.      Objective:  BP 116/78 mmHg  Pulse 63  Temp(Src) 98.3 F (36.8 C) (Oral)  Resp 12  Ht 6' (1.829 m)  Wt 213 lb 8 oz (96.843 kg)  BMI 28.95 kg/m2  SpO2 98%  Physical Exam  General Appearance:    Alert, cooperative, no distress, appears stated age  Head:    Normocephalic, without obvious abnormality, atraumatic  Eyes:    PERRL, conjunctiva/corneas clear, EOM's intact, fundi    benign, both eyes  Ears:    Normal TM's and external ear canals, both ears  Nose:   Nares normal, septum midline, mucosa normal, no drainage    or sinus tenderness  Throat:   Lips, mucosa, and tongue normal; teeth and gums normal  Neck:   Supple, symmetrical, trachea midline, no adenopathy;    thyroid:  no enlargement/tenderness/nodules; no carotid   bruit or JVD  Back:     Symmetric, no curvature, ROM normal, no CVA tenderness  Lungs:     Clear to auscultation bilaterally, respirations unlabored  Chest Wall:    No tenderness or deformity   Heart:    Regular rate and rhythm, S1 and S2 normal, no murmur, rub   or gallop  Breast Exam:    No tenderness, masses, or nipple abnormality  Abdomen:     Soft,  non-tender, bowel sounds active all four quadrants,    no masses, no organomegaly  Genitalia:    Pelvic: cervix normal in appearance, external genitalia normal, no adnexal masses or tenderness, no cervical motion tenderness, rectovaginal septum normal, uterus normal size, shape, and consistency and vagina normal without discharge  Extremities:   Extremities normal, atraumatic, no cyanosis or edema  Pulses:   2+ and symmetric all extremities  Skin:   Skin color, texture, turgor normal, no rashes or lesions  Lymph nodes:   Cervical, supraclavicular, and axillary nodes normal  Neurologic:   CNII-XII intact, normal strength, sensation and reflexes    throughout      Assessment & Plan:   Problem List Items Addressed This Visit    Irritable bowel syndrome    Diarrhea predominant.  Occurring during high stress periods,  Discussed use of daily immodium prn       Generalized anxiety disorder    Greater than half of f 25 minutes of face to face time was spent with patient more  than half of which was spent in counselling about the above mentioned conditions  and coordination of care .  /advised to resume lexapro       Overweight (BMI 25.0-29.9)    I have addressed  BMI and recommended wt loss of 10% of body weigh over the next 6 months using a low glycemic index diet and regular exercise a minimum of 5 days per week.        Encounter for preventive health examination    Annual comprehensive preventive exam was done as well as an evaluation and management of chronic conditions .  During the course of the visit the patient was educated and counseled about appropriate screening and preventive services including :  diabetes screening, lipid analysis with projected  10 year  risk for CAD  nutrition counseling, colorectal cancer screening, and recommended immunizations.  Printed recommendations for health maintenance screenings was given.       Sinus pressure    sinus drainage and congestion without   cough or  headaches .  Patient  Was advised to use daily saline flushes and prn OTC  decongestants,   advil and tylenol .         Other Visit Diagnoses    Screen for STD (sexually transmitted disease)    -  Primary    Relevant Orders    HIV antibody    Hepatitis C antibody    Other fatigue        Relevant Orders    CBC with Differential/Platelet    Comprehensive metabolic panel    TSH    Hyperlipidemia        Relevant Orders    Lipid panel    Nocturia        Relevant Orders    POCT urinalysis dipstick (Completed)       I have discontinued Mr. Shin PARoxetine and PARoxetine. I am also having him start on escitalopram and Olopatadine HCl. Additionally, I am having him maintain his acetaminophen and MULTIPLE VITAMIN PO.  Meds ordered this encounter  Medications  . escitalopram (LEXAPRO) 10 MG tablet    Sig: Take 1 tablet (10 mg total) by mouth daily.    Dispense:  90 tablet    Refill:  1  . Olopatadine HCl 0.2 % SOLN    Sig: Apply 1 drop to eye daily.    Dispense:  7.5 mL    Refill:  1    90 DAY SUPPLY    Medications Discontinued During This Encounter  Medication Reason  . PARoxetine (PAXIL-CR) 12.5 MG 24 hr tablet Patient Preference  . PARoxetine (PAXIL-CR) 12.5 MG 24 hr tablet Error    Follow-up: No Follow-up on file.   Crecencio Mc, MD

## 2015-02-02 ENCOUNTER — Encounter: Payer: Self-pay | Admitting: Internal Medicine

## 2015-02-02 NOTE — Assessment & Plan Note (Signed)
Diarrhea predominant.  Occurring during high stress periods,  Discussed use of daily immodium prn

## 2015-02-02 NOTE — Assessment & Plan Note (Signed)
I have addressed  BMI and recommended wt loss of 10% of body weigh over the next 6 months using a low glycemic index diet and regular exercise a minimum of 5 days per week.   

## 2015-02-02 NOTE — Assessment & Plan Note (Signed)
Greater than half of f 25 minutes of face to face time was spent with patient more than half of which was spent in counselling about the above mentioned conditions  and coordination of care .  /advised to resume lexapro

## 2015-02-02 NOTE — Assessment & Plan Note (Signed)
Annual comprehensive preventive exam was done as well as an evaluation and management of chronic conditions .  During the course of the visit the patient was educated and counseled about appropriate screening and preventive services including :  diabetes screening, lipid analysis with projected  10 year  risk for CAD  nutrition counseling, colorectal cancer screening, and recommended immunizations.  Printed recommendations for health maintenance screenings was given.

## 2015-02-02 NOTE — Assessment & Plan Note (Signed)
sinus drainage and congestion without  cough or  headaches .  Patient  Was advised to use daily saline flushes and prn OTC  decongestants,   advil and tylenol .

## 2015-02-10 ENCOUNTER — Other Ambulatory Visit (INDEPENDENT_AMBULATORY_CARE_PROVIDER_SITE_OTHER): Payer: 59

## 2015-02-10 DIAGNOSIS — E785 Hyperlipidemia, unspecified: Secondary | ICD-10-CM

## 2015-02-10 DIAGNOSIS — R5383 Other fatigue: Secondary | ICD-10-CM

## 2015-02-10 LAB — CBC WITH DIFFERENTIAL/PLATELET
BASOS PCT: 0.6 % (ref 0.0–3.0)
Basophils Absolute: 0 10*3/uL (ref 0.0–0.1)
EOS ABS: 0 10*3/uL (ref 0.0–0.7)
Eosinophils Relative: 0.4 % (ref 0.0–5.0)
HEMATOCRIT: 45.5 % (ref 39.0–52.0)
Hemoglobin: 15.4 g/dL (ref 13.0–17.0)
LYMPHS ABS: 1.2 10*3/uL (ref 0.7–4.0)
LYMPHS PCT: 19.2 % (ref 12.0–46.0)
MCHC: 33.8 g/dL (ref 30.0–36.0)
MCV: 86.3 fl (ref 78.0–100.0)
Monocytes Absolute: 0.7 10*3/uL (ref 0.1–1.0)
Monocytes Relative: 10.9 % (ref 3.0–12.0)
NEUTROS ABS: 4.2 10*3/uL (ref 1.4–7.7)
NEUTROS PCT: 68.9 % (ref 43.0–77.0)
PLATELETS: 225 10*3/uL (ref 150.0–400.0)
RBC: 5.27 Mil/uL (ref 4.22–5.81)
RDW: 13.5 % (ref 11.5–15.5)
WBC: 6.1 10*3/uL (ref 4.0–10.5)

## 2015-02-10 LAB — LIPID PANEL
CHOLESTEROL: 187 mg/dL (ref 0–200)
HDL: 39.5 mg/dL (ref >39.00)
LDL CALC: 117 mg/dL — AB (ref 0–99)
NonHDL: 147.09
TRIGLYCERIDES: 150 mg/dL — AB (ref 0.0–149.0)
Total CHOL/HDL Ratio: 5
VLDL: 30 mg/dL (ref 0.0–40.0)

## 2015-02-10 LAB — COMPREHENSIVE METABOLIC PANEL
ALT: 29 U/L (ref 0–53)
AST: 27 U/L (ref 0–37)
Albumin: 4.5 g/dL (ref 3.5–5.2)
Alkaline Phosphatase: 61 U/L (ref 39–117)
BUN: 19 mg/dL (ref 6–23)
CALCIUM: 9.7 mg/dL (ref 8.4–10.5)
CHLORIDE: 102 meq/L (ref 96–112)
CO2: 28 meq/L (ref 19–32)
Creatinine, Ser: 1.1 mg/dL (ref 0.40–1.50)
GFR: 76.11 mL/min (ref >60.00)
Glucose, Bld: 99 mg/dL (ref 70–99)
Potassium: 4.3 mEq/L (ref 3.5–5.1)
Sodium: 139 mEq/L (ref 135–145)
Total Bilirubin: 0.9 mg/dL (ref 0.2–1.2)
Total Protein: 7 g/dL (ref 6.0–8.3)

## 2015-02-10 LAB — TSH: TSH: 1.48 u[IU]/mL (ref 0.35–4.50)

## 2015-02-12 ENCOUNTER — Encounter: Payer: Self-pay | Admitting: Internal Medicine

## 2015-02-27 NOTE — Telephone Encounter (Signed)
Mailed unread message to patient.  

## 2015-06-05 ENCOUNTER — Ambulatory Visit (INDEPENDENT_AMBULATORY_CARE_PROVIDER_SITE_OTHER): Payer: 59 | Admitting: Family Medicine

## 2015-06-05 ENCOUNTER — Encounter: Payer: Self-pay | Admitting: Family Medicine

## 2015-06-05 VITALS — BP 118/72 | HR 81 | Temp 98.2°F | Wt 215.4 lb

## 2015-06-05 DIAGNOSIS — B349 Viral infection, unspecified: Secondary | ICD-10-CM

## 2015-06-05 LAB — POCT INFLUENZA A/B
INFLUENZA B, POC: NEGATIVE
Influenza A, POC: NEGATIVE

## 2015-06-05 LAB — POCT RAPID STREP A (OFFICE): RAPID STREP A SCREEN: NEGATIVE

## 2015-06-05 MED ORDER — HYDROCOD POLST-CPM POLST ER 10-8 MG/5ML PO SUER: 5.0000 mL | Freq: Two times a day (BID) | ORAL | Status: DC | PRN

## 2015-06-05 NOTE — Progress Notes (Signed)
Pre visit review using our clinic review tool, if applicable. No additional management support is needed unless otherwise documented below in the visit note. 

## 2015-06-05 NOTE — Assessment & Plan Note (Signed)
New problem. Rapid flu and rapid strep negative today. Patient has been afebrile making flu unlikely. Treating cough with Tussionex. Likely viral in origin given sick contacts at home. No evidence of bacterial infection at this time.

## 2015-06-05 NOTE — Patient Instructions (Signed)
This appears to be viral (likely from your kids).  Flu and strep tests were negative.  Continue supportive care: Tylenol/Ibuprofen, fluids.  Take care  Dr. Lacinda Axon

## 2015-06-05 NOTE — Progress Notes (Signed)
Subjective:  Patient ID: Travis Bennett, male    DOB: 1965-07-22  Age: 50 y.o. MRN: [id number]  CC: Body aches, low grade temp, cough, sore throat  HPI:  50 year old male presents to clinic today for an acute visit with the above complaints.  She states that he's been sick since Tuesday. He has had known sick contacts as all of his children at home are sick. He reports low-grade temperature, Tmax 99.8. He also reports associated cough, body aches, sore throat. He's been taking Tylenol and ibuprofen as well as cough syrup with no improvement. No known exacerbating factors. He states that given his sick children at home his wife thought he should be evaluated. No other complaints today.  Social Hx   Social History   Social History  . Marital Status: Single    Spouse Name: N/A  . Number of Children: N/A  . Years of Education: N/A   Social History Main Topics  . Smoking status: Never Smoker   . Smokeless tobacco: Never Used  . Alcohol Use: 3.5 oz/week    7 drink(s) per week  . Drug Use: No  . Sexual Activity:    Partners: Female   Other Topics Concern  . None   Social History Narrative   Married: 6 kids (blended family)   Luz [employer] with work   Regular exercise: not recently   Caffeine use: coffee in the am   Review of Systems  Constitutional: Positive for fatigue.  HENT: Positive for sore throat.   Respiratory: Positive for cough.   Musculoskeletal:       Body aches.    Objective:  BP 118/72 mmHg  Pulse 81  Temp(Src) 98.2 F (36.8 C) (Oral)  Wt 215 lb 6 oz (97.693 kg)  SpO2 93%  BP/Weight 06/05/2015 01/31/2015 [id number]  Systolic BP 123456 [id number]  Diastolic BP 72 78 78  Wt. (Lbs) 215.38 213.5 213.13  BMI 29.2 28.95 28.9   Physical Exam  Constitutional: He is oriented to person, place, and time. He appears well-developed. No distress.  HENT:  Head: Normocephalic and atraumatic.  Oropharynx with mild erythema. No exudate. Normal TMs bilaterally.  Neck: Neck  supple.  Cardiovascular: Normal rate and regular rhythm.   Pulmonary/Chest: Effort normal and breath sounds normal. No respiratory distress. He has no wheezes. He has no rales.  Lymphadenopathy:    He has no cervical adenopathy.  Neurological: He is alert and oriented to person, place, and time.  Vitals reviewed.  Lab Results  Component Value Date   WBC 6.1 02/10/2015   HGB 15.4 02/10/2015   HCT 45.5 02/10/2015   PLT 225.0 02/10/2015   GLUCOSE 99 02/10/2015   CHOL 187 02/10/2015   TRIG 150.0* 02/10/2015   HDL 39.50 02/10/2015   LDLCALC 117* 02/10/2015   ALT 29 02/10/2015   AST 27 02/10/2015   NA 139 02/10/2015   K 4.3 02/10/2015   CL 102 02/10/2015   CREATININE 1.10 02/10/2015   BUN 19 02/10/2015   CO2 28 02/10/2015   TSH 1.48 02/10/2015   PSA 0.75 03/14/2013   HGBA1C 5.1 03/14/2013   Assessment & Plan:   Problem List Items Addressed This Visit    Viral illness - Primary    New problem. Rapid flu and rapid strep negative today. Patient has been afebrile making flu unlikely. Treating cough with Tussionex. Likely viral in origin given sick contacts at home. No evidence of bacterial infection at this time.      Relevant Orders  POCT Influenza A/B (Completed)   POCT rapid strep A (Completed)   Culture, Group A Strep      Meds ordered this encounter  Medications  . chlorpheniramine-HYDROcodone (TUSSIONEX PENNKINETIC ER) 10-8 MG/5ML SUER    Sig: Take 5 mLs by mouth every 12 (twelve) hours as needed.    Dispense:  115 mL    Refill:  0   Follow-up: PRN  [place]

## 2015-06-07 LAB — CULTURE, GROUP A STREP: ORGANISM ID, BACTERIA: NORMAL

## 2015-07-25 ENCOUNTER — Other Ambulatory Visit: Payer: Self-pay | Admitting: Internal Medicine

## 2015-09-09 ENCOUNTER — Telehealth: Payer: Self-pay | Admitting: Internal Medicine

## 2015-09-09 MED ORDER — ESCITALOPRAM OXALATE 10 MG PO TABS: ORAL_TABLET | ORAL | Status: DC

## 2015-09-09 NOTE — Telephone Encounter (Signed)
Pt sent a request needing a refill for escitalopram (LEXAPRO) 10 MG tablet. Pt has no more refills. Pharmacy is [facility] #[id number] Lorina Rabon, [place] is sch to come in on 10/10/15. Call pt @ [phone]. Thank you!

## 2015-09-09 NOTE — Telephone Encounter (Signed)
Refill sent.

## 2015-10-10 ENCOUNTER — Ambulatory Visit: Payer: 59 | Admitting: Internal Medicine

## 2015-10-14 ENCOUNTER — Ambulatory Visit (INDEPENDENT_AMBULATORY_CARE_PROVIDER_SITE_OTHER): Payer: 59 | Admitting: Internal Medicine

## 2015-10-14 ENCOUNTER — Encounter: Payer: Self-pay | Admitting: Internal Medicine

## 2015-10-14 VITALS — BP 130/84 | HR 72 | Temp 98.2°F | Resp 12 | Ht 72.0 in | Wt 214.5 lb

## 2015-10-14 DIAGNOSIS — F411 Generalized anxiety disorder: Secondary | ICD-10-CM | POA: Diagnosis not present

## 2015-10-14 DIAGNOSIS — E663 Overweight: Secondary | ICD-10-CM

## 2015-10-14 MED ORDER — ESCITALOPRAM OXALATE 10 MG PO TABS: ORAL_TABLET | ORAL | Status: DC

## 2015-10-14 MED ORDER — ALPRAZOLAM 0.25 MG PO TABS: 0.2500 mg | ORAL_TABLET | Freq: Every day | ORAL | Status: DC

## 2015-10-14 MED ORDER — DOXYCYCLINE HYCLATE 100 MG PO CAPS: 100.0000 mg | ORAL_CAPSULE | Freq: Two times a day (BID) | ORAL | Status: DC

## 2015-10-14 NOTE — Progress Notes (Signed)
Pre-visit discussion using our clinic review tool. No additional management support is needed unless otherwise documented below in the visit note.  

## 2015-10-14 NOTE — Patient Instructions (Addendum)
Trial of alprazolam to help you "turn your brain off " at night .  Low dose    Continue lexapro  daily,  I have refilled  It for another 3 months    If you develop fever (T 80f 100.4 or higher) and a headache with a recent tick exposure, start the doxycycline.  Take it twice daily with food and avoid direct sunlight

## 2015-10-14 NOTE — Progress Notes (Addendum)
Subjective:  Patient ID: Travis Bennett, male    DOB: [birth year]  Age: 50 y.o. MRN: [id number]  CC: The primary encounter diagnosis was Generalized anxiety disorder. A diagnosis of Overweight (BMI 25.0-29.9) was also pertinent to this visit.   Travis Bennett presents for medication refill and follow up on GAD.  Tolerating Lexapro , but having increased emotional stressors .  Ran out of Lexpro , went 1.5 months out of it  For unclear reasons,.  Medication refill history shows that the medication was refilled.  No record of patient calling to report  That he did not receive  medication.   His anxiety increases every week when he has to interact with his ex wife to share custody of children.  He anticipates increased levels with the impending mediation .  Trouble sleeping.  Gets sleepy at 9,  But still has work to do so pushes through until 11 pm .  Then at 11 pm he is awake again and can't sleep .  Not exercising,  Not eating well,.  Increased fatigue for the last week . Travelled a lot last month,  Haphazard travel schedules ,  Not weekly     Outpatient Prescriptions Prior to Visit  Medication Sig Dispense Refill  . acetaminophen (TYLENOL) 325 MG tablet Take 650 mg by mouth every 6 (six) hours as needed for pain.    Marland Kitchen Olopatadine HCl 0.2 % SOLN Apply 1 drop to eye daily. 7.5 mL 1  . escitalopram (LEXAPRO) 10 MG tablet TAKE 1 TABLET (10 MG TOTAL) BY MOUTH DAILY. 90 tablet 0  . MULTIPLE VITAMIN PO Take by mouth. Reported on 10/14/2015    . chlorpheniramine-HYDROcodone (TUSSIONEX PENNKINETIC ER) 10-8 MG/5ML SUER Take 5 mLs by mouth every 12 (twelve) hours as needed. 115 mL 0   No facility-administered medications prior to visit.    Review of Systems;  Patient denies headache, fevers, malaise, unintentional weight loss, skin rash, eye pain, sinus congestion and sinus pain, sore throat, dysphagia,  hemoptysis , cough, dyspnea, wheezing, chest pain, palpitations, orthopnea, edema, abdominal pain,  nausea, melena, diarrhea, constipation, flank pain, dysuria, hematuria, urinary  Frequency, nocturia, numbness, tingling, seizures,  Focal weakness, Loss of consciousness,  Tremor, depression,and suicidal ideation.      Objective:  BP 130/84 mmHg  Pulse 72  Temp(Src) 98.2 F (36.8 C) (Oral)  Resp 12  Ht 6' (1.829 m)  Wt 214 lb 8 oz (97.297 kg)  BMI 29.09 kg/m2  SpO2 97%  BP Readings from Last 3 Encounters:  10/14/15 130/84  06/05/15 118/72  01/31/15 116/78    Wt Readings from Last 3 Encounters:  10/14/15 214 lb 8 oz (97.297 kg)  06/05/15 215 lb 6 oz (97.693 kg)  01/31/15 213 lb 8 oz (96.843 kg)    General appearance: alert, cooperative and appears stated age Ears: normal TM's and external ear canals both ears Throat: lips, mucosa, and tongue normal; teeth and gums normal Neck: no adenopathy, no carotid bruit, supple, symmetrical, trachea midline and thyroid not enlarged, symmetric, no tenderness/mass/nodules Back: symmetric, no curvature. ROM normal. No CVA tenderness. Lungs: clear to auscultation bilaterally Heart: regular rate and rhythm, S1, S2 normal, no murmur, click, rub or gallop Abdomen: soft, non-tender; bowel sounds normal; no masses,  no organomegaly Pulses: 2+ and symmetric Skin: Skin color, texture, turgor normal. No rashes or lesions Lymph nodes: Cervical, supraclavicular, and axillary nodes normal. Psych: affect normal, makes good eye contact. No fidgeting,  Smiles easily.  Denies suicidal thoughts  Lab Results  Component Value Date   HGBA1C 5.1 03/14/2013    Lab Results  Component Value Date   CREATININE 1.10 02/10/2015   CREATININE 1.1 11/28/2012    Lab Results  Component Value Date   WBC 6.1 02/10/2015   HGB 15.4 02/10/2015   HCT 45.5 02/10/2015   PLT 225.0 02/10/2015   GLUCOSE 99 02/10/2015   CHOL 187 02/10/2015   TRIG 150.0* 02/10/2015   HDL 39.50 02/10/2015   LDLCALC 117* 02/10/2015   ALT 29 02/10/2015   AST 27 02/10/2015   NA 139  02/10/2015   K 4.3 02/10/2015   CL 102 02/10/2015   CREATININE 1.10 02/10/2015   BUN 19 02/10/2015   CO2 28 02/10/2015   TSH 1.48 02/10/2015   PSA 0.75 03/14/2013   HGBA1C 5.1 03/14/2013    Patient was never admitted.  Assessment & Plan:   Problem List Items Addressed This Visit    Generalized anxiety disorder - Primary    Previous SSRI trials reviewed with patient.  Advised to continue Lexapro and will add low dose alprazolam for use when he has insomnia due to anxiety.       Overweight (BMI 25.0-29.9)    .I have addressed  BMI and recommended wt loss of 10% of body weigh over the next 6 months using a low glycemic index diet and regular exercise a minimum of 5 days per week.          A total of 40 minutes of face to face time was spent with patient more than half of which was spent in counselling about the above mentioned conditions  and coordination of care   I have discontinued Mr. Bedillion chlorpheniramine-HYDROcodone. I am also having him start on ALPRAZolam and doxycycline. Additionally, I am having him maintain his acetaminophen, MULTIPLE VITAMIN PO, Olopatadine HCl, and escitalopram.  Meds ordered this encounter  Medications  . ALPRAZolam (XANAX) 0.25 MG tablet    Sig: Take 1 tablet (0.25 mg total) by mouth at bedtime.    Dispense:  30 tablet    Refill:  1  . escitalopram (LEXAPRO) 10 MG tablet    Sig: TAKE 1 TABLET (10 MG TOTAL) BY MOUTH DAILY.    Dispense:  90 tablet    Refill:  2    Keep on file for future refills  . doxycycline (VIBRAMYCIN) 100 MG capsule    Sig: Take 1 capsule (100 mg total) by mouth 2 (two) times daily.    Dispense:  14 capsule    Refill:  0    Medications Discontinued During This Encounter  Medication Reason  . chlorpheniramine-HYDROcodone (TUSSIONEX PENNKINETIC ER) 10-8 MG/5ML SUER Completed Course  . escitalopram (LEXAPRO) 10 MG tablet Reorder    Follow-up: No Follow-up on file.   Crecencio Mc, MD

## 2015-10-15 NOTE — Assessment & Plan Note (Signed)
I have addressed  BMI and recommended wt loss of 10% of body weigh over the next 6 months using a low glycemic index diet and regular exercise a minimum of 5 days per week.   

## 2015-10-15 NOTE — Assessment & Plan Note (Signed)
Previous SSRI trials reviewed with patient.  Advised to continue Lexapro and will add low dose alprazolam for use when he has insomnia due to anxiety.

## 2015-11-17 ENCOUNTER — Other Ambulatory Visit: Payer: Self-pay | Admitting: Internal Medicine

## 2015-11-17 NOTE — Telephone Encounter (Signed)
Received refill request electronically Medication is not on medication list Last office visit 10/14/15

## 2016-02-02 ENCOUNTER — Encounter: Payer: Self-pay | Admitting: Internal Medicine

## 2016-02-02 ENCOUNTER — Ambulatory Visit (INDEPENDENT_AMBULATORY_CARE_PROVIDER_SITE_OTHER): Payer: 59 | Admitting: Internal Medicine

## 2016-02-02 VITALS — BP 130/94 | HR 68 | Temp 98.2°F | Resp 12 | Ht 72.0 in | Wt 212.2 lb

## 2016-02-02 DIAGNOSIS — K580 Irritable bowel syndrome with diarrhea: Secondary | ICD-10-CM

## 2016-02-02 DIAGNOSIS — R5383 Other fatigue: Secondary | ICD-10-CM

## 2016-02-02 DIAGNOSIS — Z0000 Encounter for general adult medical examination without abnormal findings: Secondary | ICD-10-CM

## 2016-02-02 DIAGNOSIS — K591 Functional diarrhea: Secondary | ICD-10-CM

## 2016-02-02 DIAGNOSIS — F411 Generalized anxiety disorder: Secondary | ICD-10-CM | POA: Diagnosis not present

## 2016-02-02 DIAGNOSIS — Z23 Encounter for immunization: Secondary | ICD-10-CM

## 2016-02-02 DIAGNOSIS — E785 Hyperlipidemia, unspecified: Secondary | ICD-10-CM

## 2016-02-02 LAB — COMPREHENSIVE METABOLIC PANEL
ALK PHOS: 55 U/L (ref 39–117)
ALT: 23 U/L (ref 0–53)
AST: 21 U/L (ref 0–37)
Albumin: 5 g/dL (ref 3.5–5.2)
BUN: 14 mg/dL (ref 6–23)
CHLORIDE: 101 meq/L (ref 96–112)
CO2: 31 meq/L (ref 19–32)
Calcium: 9.6 mg/dL (ref 8.4–10.5)
Creatinine, Ser: 1.17 mg/dL (ref 0.40–1.50)
GFR: 70.59 mL/min (ref >60.00)
GLUCOSE: 101 mg/dL — AB (ref 70–99)
POTASSIUM: 4.1 meq/L (ref 3.5–5.1)
Sodium: 139 mEq/L (ref 135–145)
Total Bilirubin: 0.9 mg/dL (ref 0.2–1.2)
Total Protein: 7.5 g/dL (ref 6.0–8.3)

## 2016-02-02 LAB — LIPID PANEL
CHOL/HDL RATIO: 4
Cholesterol: 192 mg/dL (ref 0–200)
HDL: 43 mg/dL (ref >39.00)
LDL Cholesterol: 114 mg/dL — ABNORMAL HIGH (ref 0–99)
NONHDL: 148.88
Triglycerides: 175 mg/dL — ABNORMAL HIGH (ref 0.0–149.0)
VLDL: 35 mg/dL (ref 0.0–40.0)

## 2016-02-02 LAB — MAGNESIUM: Magnesium: 2.3 mg/dL (ref 1.5–2.5)

## 2016-02-02 LAB — TSH: TSH: 2.68 u[IU]/mL (ref 0.35–4.50)

## 2016-02-02 MED ORDER — ESCITALOPRAM OXALATE 10 MG PO TABS: ORAL_TABLET | ORAL | 2 refills | Status: DC

## 2016-02-02 MED ORDER — ALPRAZOLAM 0.25 MG PO TABS: 0.2500 mg | ORAL_TABLET | Freq: Every day | ORAL | 5 refills | Status: DC

## 2016-02-02 NOTE — Progress Notes (Signed)
Pre-visit discussion using our clinic review tool. No additional management support is needed unless otherwise documented below in the visit note.  

## 2016-02-02 NOTE — Progress Notes (Signed)
Patient ID: Lena Santilli, male    DOB: 09-27-1965  Age: 50 y.o. MRN: [id number]  The patient is here for annual   wellness examination and management of other chronic and acute problems.   The risk factors are reflected in the social history.  The roster of all physicians providing medical care to patient - is listed in the Snapshot section of the chart.   Home safety : The patient has smoke detectors in the home. They wear seatbelts.  There are no firearms at home. There is no violence in the home.   There is no risks for hepatitis, STDs or HIV. There is no   history of blood transfusion. They have no travel history to infectious disease endemic areas of the world.  The patient has seen their dentist in the last six month. They have seen their eye doctor in the last year. They admit to slight hearing difficulty with regard to whispered voices and some television programs.  They have deferred audiologic testing in the last year.  They do not  have excessive sun exposure. Discussed the need for sun protection: hats, long sleeves and use of sunscreen if there is significant sun exposure.   Diet: the importance of a healthy diet is discussed. They do have a healthy diet.  The benefits of regular aerobic exercise were discussed. She walks 4 times per week ,  20 minutes.   Depression screen: there are no signs or vegative symptoms of depression- irritability, change in appetite, anhedonia, sadness/tearfullness.  The following portions of the patient's history were reviewed and updated as appropriate: allergies, current medications, past family history, past medical history,  past surgical history, past social history  and problem list.  Visual acuity was not assessed per patient preference since she has regular follow up with her ophthalmologist. Hearing and body mass index were assessed and reviewed.   During the course of the visit the patient was educated and counseled about appropriate  screening and preventive services including : fall prevention , diabetes screening, nutrition counseling, colorectal cancer screening, and recommended immunizations.    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 Diagnoses of Fatigue, unspecified type, Hyperlipidemia LDL goal <130, Generalized anxiety disorder, Irritable bowel syndrome with diarrhea, and Encounter for preventive health examination were also pertinent to this visit.   Chronic diarrhea predominant IBS.  Has given up alcohol but can't give up his morning coffee. lifted starches helps.  Aggravated by stress. .  Prior trials of Bentyl did not help.  Immodium has helped the most.   Stress of 6 kids  Nasty x wife and embroiled in a custody battle.  One son has ADHD and Asberger's.   Insomnia.  No longer using xanax.  Wide awake and mind racing  . Falls asleep at 2:30 am  .  Falling asleep during the day.   Added magnesium and fish oil.    History Jarvon has a past medical history of Blood in stool; Chicken pox; Chronic headaches; Depression; and Irritable bowel syndrome.   He has a past surgical history that includes Tonsillectomy and adenoidectomy (1970); Anal fistulectomy (1996); and Anal fistulectomy (N/A, 1995).   His family history includes Alcohol abuse in his maternal grandmother; Alzheimer's disease in his paternal grandmother; Cancer in his father, maternal grandmother, and paternal grandfather; Early death in his maternal aunt; Heart disease in his paternal grandfather; Heart disease (age of onset: 4) in his maternal grandfather; Hyperlipidemia in his mother.He reports that he has never smoked.  He has never used smokeless tobacco. He reports that he drinks about 3.5 oz of alcohol per week . He reports that he does not use drugs.  Outpatient Medications Prior to Visit  Medication Sig Dispense Refill  . acetaminophen (TYLENOL) 325 MG tablet Take 650 mg by mouth every 6 (six) hours as needed for pain.    Marland Kitchen  doxycycline (VIBRAMYCIN) 100 MG capsule Take 1 capsule (100 mg total) by mouth 2 (two) times daily. 14 capsule 0  . MULTIPLE VITAMIN PO Take by mouth. Reported on 10/14/2015    . PATADAY 0.2 % SOLN PUT 1 DROP INTO BOTH EYES EVERY DAY 7.5 mL 1  . escitalopram (LEXAPRO) 10 MG tablet TAKE 1 TABLET (10 MG TOTAL) BY MOUTH DAILY. 90 tablet 2  . ALPRAZolam (XANAX) 0.25 MG tablet Take 1 tablet (0.25 mg total) by mouth at bedtime. (Patient not taking: Reported on 02/02/2016) 30 tablet 1   No facility-administered medications prior to visit.     Review of Systems   Patient denies headache, fevers, malaise, unintentional weight loss, skin rash, eye pain, sinus congestion and sinus pain, sore throat, dysphagia,  hemoptysis , cough, dyspnea, wheezing, chest pain, palpitations, orthopnea, edema, abdominal pain, nausea, melena, , constipation, flank pain, dysuria, hematuria, urinary  Frequency, nocturia, numbness, tingling, seizures,  Focal weakness, Loss of consciousness,  Tremor, , depression, \, and suicidal ideation.      Objective:  BP (!) 130/94   Pulse 68   Temp 98.2 F (36.8 C) (Oral)   Resp 12   Ht 6' (1.829 m)   Wt 212 lb 4 oz (96.3 kg)   SpO2 96%   BMI 28.79 kg/m   Physical Exam   General appearance: alert, cooperative and appears stated age Ears: normal TM's and external ear canals both ears Throat: lips, mucosa, and tongue normal; teeth and gums normal Neck: no adenopathy, no carotid bruit, supple, symmetrical, trachea midline and thyroid not enlarged, symmetric, no tenderness/mass/nodules Back: symmetric, no curvature. ROM normal. No CVA tenderness. Lungs: clear to auscultation bilaterally Heart: regular rate and rhythm, S1, S2 normal, no murmur, click, rub or gallop Abdomen: soft, non-tender; bowel sounds normal; no masses,  no organomegaly Pulses: 2+ and symmetric Skin: Skin color, texture, turgor normal. No rashes or lesions Lymph nodes: Cervical, supraclavicular, and axillary  nodes normal. Psych: affect normal, speech is articulate  .  makes good eye contact. No fidgeting,  Smiles easily.  Denies suicidal thoughts    Assessment & Plan:   Problem List Items Addressed This Visit    Irritable bowel syndrome    Aggravated by stress of custody battle with x wife .  Not using bentyl,  Advised using immodium qhs and q am. Stop magnesium supplement      Generalized anxiety disorder    With increased IBS symptoms and increased insomnia.  Discussed  A trial of increase in Lexapro dose to 20 mg daily and resume prn use of alprazolam. rx given       Encounter for preventive health examination    Annual comprehensive preventive exam was done as well as an evaluation and management of acute and chronic conditions .  During the course of the visit the patient was educated and counseled about appropriate screening and preventive services including :  diabetes screening, lipid analysis with projected  10 year  risk for CAD , nutrition counseling, prostate and colorectal cancer screening, and recommended.  He is up to date on colon CA screening,  bc he  receives colonoscopy every 5 years .  Flu vaccine given       Fatigue    Checking thyroid level and electroytes      Relevant Orders   Comprehensive metabolic panel   TSH    Other Visit Diagnoses    Functional diarrhea    -  Primary   Relevant Orders   Magnesium   Hyperlipidemia LDL goal <130       Relevant Orders   Lipid panel      I am having Mr. Dasilva maintain his acetaminophen, MULTIPLE VITAMIN PO, doxycycline, PATADAY, magnesium gluconate, Fish Oil-Vitamin D, ALPRAZolam, and escitalopram.  Meds ordered this encounter  Medications  . magnesium gluconate (MAGONATE) 30 MG tablet    Sig: Take 120 mg by mouth 2 (two) times daily.  . Fish Oil-Cholecalciferol (FISH OIL-VITAMIN D) 1200-1000 MG-UNIT CAPS    Sig: Take 2 capsules by mouth daily.  Marland Kitchen ALPRAZolam (XANAX) 0.25 MG tablet    Sig: Take 1 tablet (0.25 mg  total) by mouth at bedtime.    Dispense:  30 tablet    Refill:  5  . escitalopram (LEXAPRO) 10 MG tablet    Sig: TAKE 1 TABLET (10 MG TOTAL) BY MOUTH DAILY.    Dispense:  90 tablet    Refill:  2    Keep on file for future refills    Medications Discontinued During This Encounter  Medication Reason  . ALPRAZolam (XANAX) 0.25 MG tablet Reorder  . escitalopram (LEXAPRO) 10 MG tablet Reorder    Follow-up: No Follow-up on file.   Crecencio Mc, MD

## 2016-02-02 NOTE — Patient Instructions (Addendum)
You can try taking Immodium at bedtime and repeat in the morning 30 minutes before your coffee if needed    You can continue 5 mg melatonin every night at 9 pm. save the xanax for the nights that the mind is racing   Turn of all blue Screen devices 2 hours before bedtime .  Use books,  Television  .  NO Ipads,  Cell phones etc   We discussed increasing the lezapro to 20 mg daily as a trial for 30 days .  If it helps, let me know via Mychart and I wil change the prescription to 20 mg dose  Health Maintenance, Male A healthy lifestyle and preventative care can promote health and wellness.  Maintain regular health, dental, and eye exams.  Eat a healthy diet. Foods like vegetables, fruits, whole grains, low-fat dairy products, and lean protein foods contain the nutrients you need and are low in calories. Decrease your intake of foods high in solid fats, added sugars, and salt. Get information about a proper diet from your health care provider, if necessary.  Regular physical exercise is one of the most important things you can do for your health. Most adults should get at least 150 minutes of moderate-intensity exercise (any activity that increases your heart rate and causes you to sweat) each week. In addition, most adults need muscle-strengthening exercises on 2 or more days a week.   Maintain a healthy weight. The body mass index (BMI) is a screening tool to identify possible weight problems. It provides an estimate of body fat based on height and weight. Your health care provider can find your BMI and can help you achieve or maintain a healthy weight. For males 20 years and older:  A BMI below 18.5 is considered underweight.  A BMI of 18.5 to 24.9 is normal.  A BMI of 25 to 29.9 is considered overweight.  A BMI of 30 and above is considered obese.  Maintain normal blood lipids and cholesterol by exercising and minimizing your intake of saturated fat. Eat a balanced diet with plenty of  fruits and vegetables. Blood tests for lipids and cholesterol should begin at age 107 and be repeated every 5 years. If your lipid or cholesterol levels are high, you are over age 32, or you are at high risk for heart disease, you may need your cholesterol levels checked more frequently.Ongoing high lipid and cholesterol levels should be treated with medicines if diet and exercise are not working.  If you smoke, find out from your health care provider how to quit. If you do not use tobacco, do not start.  Lung cancer screening is recommended for adults aged 57-80 years who are at high risk for developing lung cancer because of a history of smoking. A yearly low-dose CT scan of the lungs is recommended for people who have at least a 30-pack-year history of smoking and are current smokers or have quit within the past 15 years. A pack year of smoking is smoking an average of 1 pack of cigarettes a day for 1 year (for example, a 30-pack-year history of smoking could mean smoking 1 pack a day for 30 years or 2 packs a day for 15 years). Yearly screening should continue until the smoker has stopped smoking for at least 15 years. Yearly screening should be stopped for people who develop a health problem that would prevent them from having lung cancer treatment.  If you choose to drink alcohol, do not have more than  2 drinks per day. One drink is considered to be 12 oz (360 mL) of beer, 5 oz (150 mL) of wine, or 1.5 oz (45 mL) of liquor.  Avoid the use of street drugs. Do not share needles with anyone. Ask for help if you need support or instructions about stopping the use of drugs.  High blood pressure causes heart disease and increases the risk of stroke. High blood pressure is more likely to develop in:  People who have blood pressure in the end of the normal range (100-139/85-89 mm Hg).  People who are overweight or obese.  People who are African American.  If you are 72-58 years of age, have your  blood pressure checked every 3-5 years. If you are 27 years of age or older, have your blood pressure checked every year. You should have your blood pressure measured twice--once when you are at a hospital or clinic, and once when you are not at a hospital or clinic. Record the average of the two measurements. To check your blood pressure when you are not at a hospital or clinic, you can use:  An automated blood pressure machine at a pharmacy.  A home blood pressure monitor.  If you are 24-51 years old, ask your health care provider if you should take aspirin to prevent heart disease.  Diabetes screening involves taking a blood sample to check your fasting blood sugar level. This should be done once every 3 years after age 21 if you are at a normal weight and without risk factors for diabetes. Testing should be considered at a younger age or be carried out more frequently if you are overweight and have at least 1 risk factor for diabetes.  Colorectal cancer can be detected and often prevented. Most routine colorectal cancer screening begins at the age of 66 and continues through age 73. However, your health care provider may recommend screening at an earlier age if you have risk factors for colon cancer. On a yearly basis, your health care provider may provide home test kits to check for hidden blood in the stool. A small camera at the end of a tube may be used to directly examine the colon (sigmoidoscopy or colonoscopy) to detect the earliest forms of colorectal cancer. Talk to your health care provider about this at age 62 when routine screening begins. A direct exam of the colon should be repeated every 5-10 years through age 67, unless early forms of precancerous polyps or small growths are found.  People who are at an increased risk for hepatitis B should be screened for this virus. You are considered at high risk for hepatitis B if:  You were born in a country where hepatitis B occurs often. Talk  with your health care provider about which countries are considered high risk.  Your parents were born in a high-risk country and you have not received a shot to protect against hepatitis B (hepatitis B vaccine).  You have HIV or AIDS.  You use needles to inject street drugs.  You live with, or have sex with, someone who has hepatitis B.  You are a man who has sex with other men (MSM).  You get hemodialysis treatment.  You take certain medicines for conditions like cancer, organ transplantation, and autoimmune conditions.  Hepatitis C blood testing is recommended for all people born from [birth year] through 1963 and any individual with known risk factors for hepatitis C.  Healthy men should no longer receive prostate-specific antigen (PSA) blood  tests as part of routine cancer screening. Talk to your health care provider about prostate cancer screening.  Testicular cancer screening is not recommended for adolescents or adult males who have no symptoms. Screening includes self-exam, a health care provider exam, and other screening tests. Consult with your health care provider about any symptoms you have or any concerns you have about testicular cancer.  Practice safe sex. Use condoms and avoid high-risk sexual practices to reduce the spread of sexually transmitted infections (STIs).  You should be screened for STIs, including gonorrhea and chlamydia if:  You are sexually active and are younger than 24 years.  You are older than 24 years, and your health care provider tells you that you are at risk for this type of infection.  Your sexual activity has changed since you were last screened, and you are at an increased risk for chlamydia or gonorrhea. Ask your health care provider if you are at risk.  If you are at risk of being infected with HIV, it is recommended that you take a prescription medicine daily to prevent HIV infection. This is called pre-exposure prophylaxis (PrEP). You are  considered at risk if:  You are a man who has sex with other men (MSM).  You are a heterosexual man who is sexually active with multiple partners.  You take drugs by injection.  You are sexually active with a partner who has HIV.  Talk with your health care provider about whether you are at high risk of being infected with HIV. If you choose to begin PrEP, you should first be tested for HIV. You should then be tested every 3 months for as long as you are taking PrEP.  Use sunscreen. Apply sunscreen liberally and repeatedly throughout the day. You should seek shade when your shadow is shorter than you. Protect yourself by wearing long sleeves, pants, a wide-brimmed hat, and sunglasses year round whenever you are outdoors.  Tell your health care provider of new moles or changes in moles, especially if there is a change in shape or color. Also, tell your health care provider if a mole is larger than the size of a pencil eraser.  A one-time screening for abdominal aortic aneurysm (AAA) and surgical repair of large AAAs by ultrasound is recommended for men aged 79-75 years who are current or former smokers.  Stay current with your vaccines (immunizations).   This information is not intended to replace advice given to you by your health care provider. Make sure you discuss any questions you have with your health care provider.   Document Released: 10/02/2007 Document Revised: 04/26/2014 Document Reviewed: 08/31/2010 Elsevier Interactive Patient Education [employer].

## 2016-02-02 NOTE — Assessment & Plan Note (Signed)
Annual comprehensive preventive exam was done as well as an evaluation and management of acute and chronic conditions .  During the course of the visit the patient was educated and counseled about appropriate screening and preventive services including :  diabetes screening, lipid analysis with projected  10 year  risk for CAD , nutrition counseling, prostate and colorectal cancer screening, and recommended.  He is up to date on colon CA screening,  bc he receives colonoscopy every 5 years .  Flu vaccine given

## 2016-02-02 NOTE — Assessment & Plan Note (Signed)
Aggravated by stress of custody battle with x wife .  Not using bentyl,  Advised using immodium qhs and q am. Stop magnesium supplement

## 2016-02-02 NOTE — Assessment & Plan Note (Signed)
With increased IBS symptoms and increased insomnia.  Discussed  A trial of increase in Lexapro dose to 20 mg daily and resume prn use of alprazolam. rx given

## 2016-02-02 NOTE — Assessment & Plan Note (Signed)
Checking thyroid level and electroytes

## 2016-02-04 ENCOUNTER — Encounter: Payer: Self-pay | Admitting: Internal Medicine

## 2016-02-18 NOTE — Telephone Encounter (Signed)
Mailed unread message to patient.  

## 2016-05-06 ENCOUNTER — Encounter: Payer: Self-pay | Admitting: Internal Medicine

## 2016-05-07 ENCOUNTER — Other Ambulatory Visit: Payer: Self-pay | Admitting: Internal Medicine

## 2016-05-07 MED ORDER — ESCITALOPRAM OXALATE 20 MG PO TABS: ORAL_TABLET | ORAL | 5 refills | Status: DC

## 2016-05-17 DIAGNOSIS — J069 Acute upper respiratory infection, unspecified: Secondary | ICD-10-CM | POA: Diagnosis not present

## 2016-05-18 ENCOUNTER — Encounter: Payer: Self-pay | Admitting: Family

## 2016-05-18 ENCOUNTER — Ambulatory Visit (INDEPENDENT_AMBULATORY_CARE_PROVIDER_SITE_OTHER): Payer: 59

## 2016-05-18 ENCOUNTER — Ambulatory Visit (INDEPENDENT_AMBULATORY_CARE_PROVIDER_SITE_OTHER): Payer: 59 | Admitting: Family

## 2016-05-18 VITALS — BP 138/86 | HR 80 | Temp 98.1°F | Ht 72.0 in | Wt 215.8 lb

## 2016-05-18 DIAGNOSIS — J40 Bronchitis, not specified as acute or chronic: Secondary | ICD-10-CM | POA: Diagnosis not present

## 2016-05-18 MED ORDER — BENZONATATE 200 MG PO CAPS: 200.0000 mg | ORAL_CAPSULE | Freq: Two times a day (BID) | ORAL | 0 refills | Status: DC | PRN

## 2016-05-18 MED ORDER — HYDROCODONE-HOMATROPINE 5-1.5 MG/5ML PO SYRP: 5.0000 mL | ORAL_SOLUTION | Freq: Every evening | ORAL | 0 refills | Status: DC | PRN

## 2016-05-18 NOTE — Patient Instructions (Signed)
Hycodan cough syrup at bedtime. Tessalon Perles as needed. Let me know if not better in a couple of days.  Increase intake of clear fluids. Congestion is best treated by hydration, when mucus is wetter, it is thinner, less sticky, and easier to expel from the body, either through coughing up drainage, or by blowing your nose.   Get plenty of rest.   Use saline nasal drops and blow your nose frequently. Run a humidifier at night and elevate the head of the bed. Vicks Vapor rub will help with congestion and cough. Steam showers and sinus massage for congestion.   Use Acetaminophen or Ibuprofen as needed for fever or pain. Avoid second hand smoke. Even the smallest exposure will worsen symptoms.   You can also try a teaspoon of honey to see if this will help reduce cough. Throat lozenges can sometimes be beneficial as well.    This illness will typically last 7 - 10 days.   Please follow up with our clinic if you develop a fever greater than 101 F, symptoms worsen, or do not resolve in the next week.

## 2016-05-18 NOTE — Progress Notes (Signed)
Pre visit review using our clinic review tool, if applicable. No additional management support is needed unless otherwise documented below in the visit note. 

## 2016-05-18 NOTE — Progress Notes (Signed)
Subjective:    Patient ID: Travis Bennett, male    DOB: July 29, 1965, 51 y.o.   MRN: [id number]  CC: Raiford Danielewicz is a 51 y.o. male who presents today for an acute visit.    HPI: CC: dry cough x 10 days, unchanged. Endorses sinus pressure, sore throat, fatigue. Cough worse at bedtime.   No fever, N, V, ear pain. Tried tylonol cold and flu with mild improvement.  Has 6 children.   Has seen ENT, allergy testing, and on allergy medications ( listed) and on nasocort.   No lung disease     HISTORY:  Past Medical History:  Diagnosis Date  . Blood in stool   . Chicken pox   . Chronic headaches   . Depression   . Irritable bowel syndrome    Past Surgical History:  Procedure Laterality Date  . ANAL FISTULECTOMY  1996  . ANAL FISTULECTOMY N/A 1995  . TONSILLECTOMY AND ADENOIDECTOMY  1970   Family History  Problem Relation Age of Onset  . Hyperlipidemia Mother   . Cancer Maternal Grandmother     Ovarian  . Alcohol abuse Maternal Grandmother   . Alzheimer's disease Paternal Grandmother   . Cancer Paternal Grandfather     colon  . Heart disease Paternal Grandfather   . Cancer Father     throat Ca,  tobacco   . Heart disease Maternal Grandfather 50    AMI   . Early death Maternal Aunt     Allergies: Patient has no known allergies. Current Outpatient Prescriptions on File Prior to Visit  Medication Sig Dispense Refill  . ALPRAZolam (XANAX) 0.25 MG tablet Take 1 tablet (0.25 mg total) by mouth at bedtime. 30 tablet 5  . escitalopram (LEXAPRO) 20 MG tablet TAKE 1 TABLET (10 MG TOTAL) BY MOUTH DAILY. 30 tablet 5   No current facility-administered medications on file prior to visit.     Social History  Substance Use Topics  . Smoking status: Never Smoker  . Smokeless tobacco: Never Used  . Alcohol use 3.5 oz/week    7 drink(s) per week    Review of Systems  Constitutional: Positive for fatigue. Negative for chills and fever.  HENT: Positive for congestion,  postnasal drip, sinus pressure and sore throat.   Respiratory: Negative for cough, shortness of breath and wheezing.   Cardiovascular: Negative for chest pain and palpitations.  Gastrointestinal: Negative for nausea and vomiting.      Objective:    BP 138/86   Pulse 80   Temp 98.1 F (36.7 C) (Oral)   Ht 6' (1.829 m)   Wt 215 lb 12.8 oz (97.9 kg)   SpO2 98%   BMI 29.27 kg/m    Physical Exam  Constitutional: Vital signs are normal. He appears well-developed and well-nourished.  HENT:  Head: Normocephalic and atraumatic.  Right Ear: Hearing, tympanic membrane, external ear and ear canal normal. No drainage, swelling or tenderness. Tympanic membrane is not injected, not erythematous and not bulging. No middle ear effusion. No decreased hearing is noted.  Left Ear: Hearing, tympanic membrane, external ear and ear canal normal. No drainage, swelling or tenderness. Tympanic membrane is not injected, not erythematous and not bulging.  No middle ear effusion. No decreased hearing is noted.  Nose: Nose normal. Right sinus exhibits no maxillary sinus tenderness and no frontal sinus tenderness. Left sinus exhibits no maxillary sinus tenderness and no frontal sinus tenderness.  Mouth/Throat: Uvula is midline, oropharynx is clear and moist and mucous membranes  are normal. No oropharyngeal exudate, posterior oropharyngeal edema, posterior oropharyngeal erythema or tonsillar abscesses.  Eyes: Conjunctivae are normal.  Cardiovascular: Regular rhythm and normal heart sounds.   Pulmonary/Chest: Effort normal and breath sounds normal. No respiratory distress. He has no wheezes. He has no rhonchi. He has no rales.  Lymphadenopathy:       Head (right side): No submental, no submandibular, no tonsillar, no preauricular, no posterior auricular and no occipital adenopathy present.       Head (left side): No submental, no submandibular, no tonsillar, no preauricular, no posterior auricular and no occipital  adenopathy present.    He has no cervical adenopathy.  Neurological: He is alert.  Skin: Skin is warm and dry.  Psychiatric: He has a normal mood and affect. His speech is normal and behavior is normal.  Vitals reviewed.      Assessment & Plan:  1. Bronchitis Afebrile. No adventitious lung sounds. Patient and I jointly decided to delay antibiotic treatment as suspect viral etiology. We will treat with conservative measures including when necessary cough medication. Pending CXR to ensure no PNA.   - HYDROcodone-homatropine (HYCODAN) 5-1.5 MG/5ML syrup; Take 5 mLs by mouth at bedtime as needed for cough.  Dispense: 30 mL; Refill: 0 - benzonatate (TESSALON) 200 MG capsule; Take 1 capsule (200 mg total) by mouth 2 (two) times daily as needed for cough.  Dispense: 20 capsule; Refill: 0 - DG Chest 2 View     I have discontinued Mr. Squillace acetaminophen, MULTIPLE VITAMIN PO, doxycycline, PATADAY, magnesium gluconate, and Fish Oil-Vitamin D. I am also having him maintain his ALPRAZolam, escitalopram, levocetirizine, montelukast, and azelastine.   Meds ordered this encounter  Medications  . levocetirizine (XYZAL) 5 MG tablet  . montelukast (SINGULAIR) 10 MG tablet  . azelastine (OPTIVAR) 0.05 % ophthalmic solution    Return precautions given.   Risks, benefits, and alternatives of the medications and treatment plan prescribed today were discussed, and patient expressed understanding.   Education regarding symptom management and diagnosis given to patient on AVS.  Continue to follow with TULLO, Aris Everts, MD for routine health maintenance.   Ike Bene and I agreed with plan.   Mable Paris, FNP

## 2016-08-09 ENCOUNTER — Other Ambulatory Visit: Payer: Self-pay | Admitting: Internal Medicine

## 2016-08-09 NOTE — Telephone Encounter (Signed)
A voicemail was left for pt to callback and schedule a F/U. Voicemail stated rx ready at pharmacy.

## 2016-08-09 NOTE — Telephone Encounter (Signed)
Refilled: 02/02/16 Last OV:  02/02/16 Last Labs: 02/02/16 Future OV: none Please advise?

## 2016-08-09 NOTE — Telephone Encounter (Signed)
Please notify patient that the prescription  was Refilled for 30 days only because it has been  6 months since last visit. .  OFFICE VISIT NEEDED prior to any more refills  

## 2016-08-13 ENCOUNTER — Telehealth: Payer: Self-pay | Admitting: *Deleted

## 2016-08-13 NOTE — Telephone Encounter (Signed)
Patient has been scheduled for 05/14,  Patient is out of town the week of 04/07

## 2016-08-13 NOTE — Telephone Encounter (Signed)
Use a Monday night appt for this, there are a few on the 7th of may, thanks

## 2016-08-13 NOTE — Telephone Encounter (Signed)
Please give a time and date on Dr Derrel Nip Schedule to place pt for a follow up with in the next 30 days for a medication refill. Patient preferences are Monday's and Friday's . Pt contact  [phone]

## 2016-08-30 ENCOUNTER — Ambulatory Visit (INDEPENDENT_AMBULATORY_CARE_PROVIDER_SITE_OTHER): Payer: 59 | Admitting: Internal Medicine

## 2016-08-30 ENCOUNTER — Encounter: Payer: Self-pay | Admitting: Internal Medicine

## 2016-08-30 VITALS — BP 150/88 | HR 80 | Temp 98.7°F | Wt 218.2 lb

## 2016-08-30 DIAGNOSIS — I10 Essential (primary) hypertension: Secondary | ICD-10-CM

## 2016-08-30 DIAGNOSIS — F411 Generalized anxiety disorder: Secondary | ICD-10-CM

## 2016-08-30 MED ORDER — ALPRAZOLAM 0.25 MG PO TABS: 0.2500 mg | ORAL_TABLET | Freq: Two times a day (BID) | ORAL | 5 refills | Status: DC | PRN

## 2016-08-30 MED ORDER — AMLODIPINE BESYLATE 2.5 MG PO TABS: 2.5000 mg | ORAL_TABLET | Freq: Every day | ORAL | 3 refills | Status: DC

## 2016-08-30 MED ORDER — OLOPATADINE HCL 0.1 % OP SOLN: 1.0000 [drp] | Freq: Two times a day (BID) | OPHTHALMIC | 12 refills | Status: DC

## 2016-08-30 NOTE — Patient Instructions (Addendum)
  I have increased your Xanax dose so you can take up to 2 at bedtime  Continue the lexapro   I agree with quitting  drinking (at least during the week! )   starting amlodipine  For blood pressure 2.5 mg daily

## 2016-08-30 NOTE — Progress Notes (Signed)
Subjective:  Patient ID: Travis Bennett, male    DOB: 12-12-1965  Age: 51 y.o. MRN: [id number]  CC: The primary encounter diagnosis was Essential hypertension. A diagnosis of Generalized anxiety disorder was also pertinent to this visit.  HPI [name] presents for follow up on GAD managed with Xanax.  Multiple job stressors,  home stressors, psychotic ex wife.   Son has autism and ADD    bp elevated. No work life balance.   Drinks caffeine all day long, and drinks 1-3  Alcoholic beverages per night, but stopped last week.  Has started working out   Has been taking lexapro 20 mg not sure it is helping.  The xanax helps with the anxiety   Had allergy testing recently, due to annual sinusitis and fatigue. Saw [facility] Shwarma:  Told he had no allergies,  But was given singulair and xyzal  For unclear reasons.   Outpatient Medications Prior to Visit  Medication Sig Dispense Refill  . ALPRAZolam (XANAX) 0.25 MG tablet TAKE 1 TABLET BY MOUTH AT BEDTIME 30 tablet 0  . escitalopram (LEXAPRO) 20 MG tablet TAKE 1 TABLET (10 MG TOTAL) BY MOUTH DAILY. 30 tablet 5  . levocetirizine (XYZAL) 5 MG tablet     . montelukast (SINGULAIR) 10 MG tablet     . azelastine (OPTIVAR) 0.05 % ophthalmic solution     . benzonatate (TESSALON) 200 MG capsule Take 1 capsule (200 mg total) by mouth 2 (two) times daily as needed for cough. 20 capsule 0  . HYDROcodone-homatropine (HYCODAN) 5-1.5 MG/5ML syrup Take 5 mLs by mouth at bedtime as needed for cough. 30 mL 0   No facility-administered medications prior to visit.     Review of Systems;  Patient denies headache, fevers, malaise, unintentional weight loss, skin rash, eye pain, sinus congestion and sinus pain, sore throat, dysphagia,  hemoptysis , cough, dyspnea, wheezing, chest pain, palpitations, orthopnea, edema, abdominal pain, nausea, melena, diarrhea, constipation, flank pain, dysuria, hematuria, urinary  Frequency, nocturia, numbness, tingling,  seizures,  Focal weakness, Loss of consciousness,  Tremor, insomnia, depression, anxiety, and suicidal ideation.      Objective:  BP (!) 150/88   Pulse 80   Temp 98.7 F (37.1 C) (Oral)   Wt 218 lb 4 oz (99 kg)   SpO2 96%   BMI 29.60 kg/m   BP Readings from Last 3 Encounters:  08/30/16 (!) 150/88  05/18/16 138/86  02/02/16 (!) 130/94    Wt Readings from Last 3 Encounters:  08/30/16 218 lb 4 oz (99 kg)  05/18/16 215 lb 12.8 oz (97.9 kg)  02/02/16 212 lb 4 oz (96.3 kg)    General appearance: alert, cooperative and appears stated age Ears: normal TM's and external ear canals both ears Throat: lips, mucosa, and tongue normal; teeth and gums normal Neck: no adenopathy, no carotid bruit, supple, symmetrical, trachea midline and thyroid not enlarged, symmetric, no tenderness/mass/nodules Back: symmetric, no curvature. ROM normal. No CVA tenderness. Lungs: clear to auscultation bilaterally Heart: regular rate and rhythm, S1, S2 normal, no murmur, click, rub or gallop Abdomen: soft, non-tender; bowel sounds normal; no masses,  no organomegaly Pulses: 2+ and symmetric Skin: Skin color, texture, turgor normal. No rashes or lesions Lymph nodes: Cervical, supraclavicular, and axillary nodes normal.  Lab Results  Component Value Date   HGBA1C 5.1 03/14/2013    Lab Results  Component Value Date   CREATININE 1.33 08/30/2016   CREATININE 1.17 02/02/2016   CREATININE 1.10 02/10/2015  Lab Results  Component Value Date   WBC 6.1 02/10/2015   HGB 15.4 02/10/2015   HCT 45.5 02/10/2015   PLT 225.0 02/10/2015   GLUCOSE 89 08/30/2016   CHOL 192 02/02/2016   TRIG 175.0 (H) 02/02/2016   HDL 43.00 02/02/2016   LDLCALC 114 (H) 02/02/2016   ALT 26 08/30/2016   AST 26 08/30/2016   NA 142 08/30/2016   K 4.4 08/30/2016   CL 104 08/30/2016   CREATININE 1.33 08/30/2016   BUN 20 08/30/2016   CO2 27 08/30/2016   TSH 2.68 02/02/2016   PSA 0.75 03/14/2013   HGBA1C 5.1 03/14/2013    MICROALBUR <0.7 08/30/2016    Patient was never admitted.  Assessment & Plan:   Problem List Items Addressed This Visit    Generalized anxiety disorder    Continue lexapro ,  Increase alprazolam to 2 daily for short term relief of anxiet triggered by increased home stressors.        Essential hypertension - Primary    Amlodipine 2.5 mg daily added.       Relevant Medications   amLODipine (NORVASC) 2.5 MG tablet   Other Relevant Orders   Microalbumin / creatinine urine ratio (Completed)   Comprehensive metabolic panel (Completed)      I have discontinued Mr. Sangha levocetirizine, montelukast, azelastine, HYDROcodone-homatropine, and benzonatate. I have also changed his ALPRAZolam. Additionally, I am having him start on amLODipine and olopatadine.  Meds ordered this encounter  Medications  . ALPRAZolam (XANAX) 0.25 MG tablet    Sig: Take 1 tablet (0.25 mg total) by mouth 2 (two) times daily as needed for anxiety.    Dispense:  60 tablet    Refill:  5    This request is for a new prescription for a controlled substance as required by Federal/State law.  Marland Kitchen amLODipine (NORVASC) 2.5 MG tablet    Sig: Take 1 tablet (2.5 mg total) by mouth daily.    Dispense:  90 tablet    Refill:  3  . olopatadine (PATANOL) 0.1 % ophthalmic solution    Sig: Place 1 drop into both eyes 2 (two) times daily.    Dispense:  5 mL    Refill:  12    Medications Discontinued During This Encounter  Medication Reason  . azelastine (OPTIVAR) 0.05 % ophthalmic solution Error  . benzonatate (TESSALON) 200 MG capsule Error  . HYDROcodone-homatropine (HYCODAN) 5-1.5 MG/5ML syrup Error  . ALPRAZolam (XANAX) 0.25 MG tablet Reorder  . levocetirizine (XYZAL) 5 MG tablet   . montelukast (SINGULAIR) 10 MG tablet     Follow-up: Return in about 3 months (around 11/30/2016).   Travis Mc, MD

## 2016-08-31 ENCOUNTER — Other Ambulatory Visit: Payer: Self-pay

## 2016-08-31 DIAGNOSIS — I10 Essential (primary) hypertension: Secondary | ICD-10-CM | POA: Insufficient documentation

## 2016-08-31 LAB — COMPREHENSIVE METABOLIC PANEL
ALBUMIN: 4.9 g/dL (ref 3.5–5.2)
ALT: 26 U/L (ref 0–53)
AST: 26 U/L (ref 0–37)
Alkaline Phosphatase: 72 U/L (ref 39–117)
BILIRUBIN TOTAL: 0.4 mg/dL (ref 0.2–1.2)
BUN: 20 mg/dL (ref 6–23)
CALCIUM: 10.1 mg/dL (ref 8.4–10.5)
CHLORIDE: 104 meq/L (ref 96–112)
CO2: 27 mEq/L (ref 19–32)
CREATININE: 1.33 mg/dL (ref 0.40–1.50)
GFR: 60.74 mL/min (ref >60.00)
Glucose, Bld: 89 mg/dL (ref 70–99)
Potassium: 4.4 mEq/L (ref 3.5–5.1)
SODIUM: 142 meq/L (ref 135–145)
TOTAL PROTEIN: 7.1 g/dL (ref 6.0–8.3)

## 2016-08-31 LAB — MICROALBUMIN / CREATININE URINE RATIO
CREATININE, U: 138.7 mg/dL
MICROALB/CREAT RATIO: 0.5 mg/g (ref 0.0–30.0)
Microalb, Ur: <0.7 mg/dL (ref 0.0–1.9)

## 2016-08-31 MED ORDER — ESCITALOPRAM OXALATE 20 MG PO TABS: ORAL_TABLET | ORAL | 0 refills | Status: DC

## 2016-08-31 MED ORDER — ESCITALOPRAM OXALATE 20 MG PO TABS: ORAL_TABLET | ORAL | 2 refills | Status: DC

## 2016-08-31 NOTE — Assessment & Plan Note (Signed)
Continue lexapro ,  Increase alprazolam to 2 daily for short term relief of anxiet triggered by increased home stressors.

## 2016-08-31 NOTE — Assessment & Plan Note (Signed)
Amlodipine 2.5 mg daily added.

## 2016-09-01 ENCOUNTER — Encounter: Payer: Self-pay | Admitting: Internal Medicine

## 2016-09-20 DIAGNOSIS — L237 Allergic contact dermatitis due to plants, except food: Secondary | ICD-10-CM | POA: Diagnosis not present

## 2016-11-30 ENCOUNTER — Ambulatory Visit: Payer: 59 | Admitting: Internal Medicine

## 2016-12-16 ENCOUNTER — Other Ambulatory Visit: Payer: Self-pay | Admitting: Internal Medicine

## 2017-03-19 ENCOUNTER — Other Ambulatory Visit: Payer: Self-pay | Admitting: Internal Medicine

## 2017-05-06 DIAGNOSIS — H6980 Other specified disorders of Eustachian tube, unspecified ear: Secondary | ICD-10-CM | POA: Diagnosis not present

## 2017-05-11 ENCOUNTER — Ambulatory Visit (INDEPENDENT_AMBULATORY_CARE_PROVIDER_SITE_OTHER): Payer: 59 | Admitting: Primary Care

## 2017-05-11 ENCOUNTER — Encounter: Payer: Self-pay | Admitting: Primary Care

## 2017-05-11 VITALS — BP 122/78 | HR 80 | Temp 98.4°F | Wt 219.4 lb

## 2017-05-11 DIAGNOSIS — J069 Acute upper respiratory infection, unspecified: Secondary | ICD-10-CM

## 2017-05-11 MED ORDER — HYDROCOD POLST-CPM POLST ER 10-8 MG/5ML PO SUER: 5.0000 mL | Freq: Two times a day (BID) | ORAL | 0 refills | Status: DC | PRN

## 2017-05-11 MED ORDER — FLUTICASONE PROPIONATE 50 MCG/ACT NA SUSP: 1.0000 | Freq: Two times a day (BID) | NASAL | 0 refills | Status: DC

## 2017-05-11 NOTE — Patient Instructions (Signed)
Your symptoms are representative of a viral illness which will resolve on its own over time. Our goal is to treat your symptoms in order to aid your body in the healing process and to make you more comfortable.   Nasal Congestion/Ear Pressure: Try using Flonase (fluticasone) nasal spray. Instill 1 spray in each nostril twice daily.   You may take the cough suppressant twice daily as needed for cough and rest. Caution this medication contains codeine and will make you feel drowsy.  Ensure you are staying hydrated with water.  It was a pleasure meeting you!   Upper Respiratory Infection, Adult Most upper respiratory infections (URIs) are caused by a virus. A URI affects the nose, throat, and upper air passages. The most common type of URI is often called "the common cold." Follow these instructions at home:  Take medicines only as told by your doctor.  Gargle warm saltwater or take cough drops to comfort your throat as told by your doctor.  Use a warm mist humidifier or inhale steam from a shower to increase air moisture. This may make it easier to breathe.  Drink enough fluid to keep your pee (urine) clear or pale yellow.  Eat soups and other clear broths.  Have a healthy diet.  Rest as needed.  Go back to work when your fever is gone or your doctor says it is okay. ? You may need to stay home longer to avoid giving your URI to others. ? You can also wear a face mask and wash your hands often to prevent spread of the virus.  Use your inhaler more if you have asthma.  Do not use any tobacco products, including cigarettes, chewing tobacco, or electronic cigarettes. If you need help quitting, ask your doctor. Contact a doctor if:  You are getting worse, not better.  Your symptoms are not helped by medicine.  You have chills.  You are getting more short of breath.  You have brown or red mucus.  You have yellow or brown discharge from your nose.  You have pain in your  face, especially when you bend forward.  You have a fever.  You have puffy (swollen) neck glands.  You have pain while swallowing.  You have white areas in the back of your throat. Get help right away if:  You have very bad or constant: ? Headache. ? Ear pain. ? Pain in your forehead, behind your eyes, and over your cheekbones (sinus pain). ? Chest pain.  You have long-lasting (chronic) lung disease and any of the following: ? Wheezing. ? Long-lasting cough. ? Coughing up blood. ? A change in your usual mucus.  You have a stiff neck.  You have changes in your: ? Vision. ? Hearing. ? Thinking. ? Mood. This information is not intended to replace advice given to you by your health care provider. Make sure you discuss any questions you have with your health care provider. Document Released: 09/22/2007 Document Revised: 12/07/2015 Document Reviewed: 07/11/2013 Elsevier Interactive Patient Education  2018 [employer].

## 2017-05-11 NOTE — Progress Notes (Signed)
Subjective:    Patient ID: Travis Bennett, male    DOB: 07-24-1965, 52 y.o.   MRN: [id number]  HPI  Travis Bennett is a 52 year old male who presents today with a chief complaint of cough. He also reports sore throat, left ear pain/pressure, congestion. His symptoms began nine days ago.   He presented to Urgent Care five days ago with similar complaints. He tested negative for strep throat and was diagnosed with a viral URI. He was prescribed a seven day course of prednisone and cough medication with decongestant. He's taken Delsym and Sudafed OTC.   He denies fevers, chills. Overall he's feeling better since the prednisone but continues to feel tired with low energy with cough and congestion. His mucous is clear.   Review of Systems  Constitutional: Positive for fatigue. Negative for chills and fever.  HENT: Positive for congestion and ear pain. Negative for sinus pressure and sinus pain.   Respiratory: Positive for cough. Negative for shortness of breath.   Cardiovascular: Negative for chest pain.     Past Medical History:  Diagnosis Date  . Blood in stool   . Chicken pox   . Chronic headaches   . Depression   . Irritable bowel syndrome      Social History   Socioeconomic History  . Marital status: Single    Spouse name: Not on file  . Number of children: Not on file  . Years of education: Not on file  . Highest education level: Not on file  Social Needs  . Financial resource strain: Not on file  . Food insecurity - worry: Not on file  . Food insecurity - inability: Not on file  . Transportation needs - medical: Not on file  . Transportation needs - non-medical: Not on file  Occupational History  . Not on file  Tobacco Use  . Smoking status: Never Smoker  . Smokeless tobacco: Never Used  Substance and Sexual Activity  . Alcohol use: Yes    Alcohol/week: 3.5 oz    Types: 7 drink(s) per week  . Drug use: No  . Sexual activity: Yes    Partners: Female  Other  Topics Concern  . Not on file  Social History Narrative   Married: 6 kids (blended family)   Travis Bennett with work   Regular exercise: not recently   Caffeine use: coffee in the am    Past Surgical History:  Procedure Laterality Date  . ANAL FISTULECTOMY  1996  . ANAL FISTULECTOMY N/A 1995  . TONSILLECTOMY AND ADENOIDECTOMY  1970    Family History  Problem Relation Age of Onset  . Hyperlipidemia Mother   . Cancer Maternal Grandmother        Ovarian  . Alcohol abuse Maternal Grandmother   . Alzheimer's disease Paternal Grandmother   . Cancer Paternal Grandfather        colon  . Heart disease Paternal Grandfather   . Cancer Father        throat Ca,  tobacco   . Heart disease Maternal Grandfather 52       AMI   . Early death Maternal Aunt     No Known Allergies  Current Outpatient Medications on File Prior to Visit  Medication Sig Dispense Refill  . amLODipine (NORVASC) 2.5 MG tablet Take 1 tablet (2.5 mg total) by mouth daily. 90 tablet 3  . brompheniramine-pseudoephedrine-DM 30-2-10 MG/5ML syrup TAKE 10 ML BY MOUTH NIGHTLY  0  . escitalopram (LEXAPRO) 20 MG  tablet TAKE 1 TABLET BY MOUTH EVERY DAY 90 tablet 1  . olopatadine (PATANOL) 0.1 % ophthalmic solution Place 1 drop into both eyes 2 (two) times daily. 5 mL 12  . predniSONE (DELTASONE) 20 MG tablet TABLETS BY MOUTH DAILY 2 TABS DAILY FOR 3 DAYS, THEN 1 TAB DAILY FOR 4 DAYS  0  . ALPRAZolam (XANAX) 0.25 MG tablet Take 1 tablet (0.25 mg total) by mouth 2 (two) times daily as needed for anxiety. (Patient not taking: Reported on 05/11/2017) 60 tablet 5   No current facility-administered medications on file prior to visit.     BP 122/78   Pulse 80   Temp 98.4 F (36.9 C) (Oral)   Wt 219 lb 6.4 oz (99.5 kg)   SpO2 98%   BMI 29.76 kg/m    Objective:   Physical Exam  Constitutional: He appears well-nourished.  HENT:  Right Ear: Tympanic membrane and ear canal normal.  Left Ear: Tympanic membrane and ear canal  normal.  Nose: No mucosal edema. Right sinus exhibits no maxillary sinus tenderness and no frontal sinus tenderness. Left sinus exhibits no maxillary sinus tenderness and no frontal sinus tenderness.  Mouth/Throat: Oropharynx is clear and moist.  Eyes: Conjunctivae are normal.  Neck: Neck supple.  Cardiovascular: Normal rate and regular rhythm.  Pulmonary/Chest: Effort normal and breath sounds normal. He has no wheezes. He has no rales.  Skin: Skin is warm and dry.          Assessment & Plan:  URI:  Cough, fatigue, congestion x 9 days.  Overall improved but continues to be aggravated by cough.  Exam today unremarkable, does not appear sickly but does appear tired. Rx for Flonase sent to pharmacy for ear pressure. Rx for Tussionex provided to use PRN cough and rest. Discussed drowsiness precautions.  Fluids, rest, follow up PRN.  Sheral Flow, NP

## 2017-05-16 ENCOUNTER — Encounter: Payer: Self-pay | Admitting: Internal Medicine

## 2017-05-26 ENCOUNTER — Telehealth: Payer: Self-pay | Admitting: Internal Medicine

## 2017-05-26 NOTE — Telephone Encounter (Signed)
I returned the patient's called and confirmed the switch with his spouse. The appointments have been moved.

## 2017-05-26 NOTE — Telephone Encounter (Signed)
Copied from [place]. Topic: Quick Communication - See Telephone Encounter >> May 26, 2017  5:01 PM Cleaster Corin, NT wrote: CRM for notification. See Telephone encounter for:   05/26/17. Pt calling to see if he can come in tomorrow instead of his wife  wanted to switch the appt. Tried calling over to practice twice and sent skpe message. Told pt. To call back  in the morning and that I was going to send note. Pt. Is scheduled for appt. On 05-31-17 at 2:00pm wife is sched. For tomorrow 05-27-17 at 9:am. Pt can be reached at [phone]

## 2017-05-27 ENCOUNTER — Ambulatory Visit (INDEPENDENT_AMBULATORY_CARE_PROVIDER_SITE_OTHER): Payer: 59 | Admitting: Internal Medicine

## 2017-05-27 ENCOUNTER — Encounter: Payer: Self-pay | Admitting: Internal Medicine

## 2017-05-27 DIAGNOSIS — F411 Generalized anxiety disorder: Secondary | ICD-10-CM | POA: Diagnosis not present

## 2017-05-27 DIAGNOSIS — I10 Essential (primary) hypertension: Secondary | ICD-10-CM

## 2017-05-27 MED ORDER — ALPRAZOLAM 0.25 MG PO TABS: 0.2500 mg | ORAL_TABLET | Freq: Two times a day (BID) | ORAL | 5 refills | Status: DC | PRN

## 2017-05-27 MED ORDER — ESCITALOPRAM OXALATE 10 MG PO TABS: 10.0000 mg | ORAL_TABLET | Freq: Every day | ORAL | 1 refills | Status: DC

## 2017-05-27 MED ORDER — METOPROLOL SUCCINATE ER 25 MG PO TB24
25.0000 mg | ORAL_TABLET | Freq: Every day | ORAL | 3 refills | Status: DC
Start: 2017-05-27 — End: 2018-06-12

## 2017-05-27 NOTE — Patient Instructions (Addendum)
resume lexapro Please start the Lexapro (escitalopram) at 1/2 tablet daily in the evening for the first few days to avoid nausea.  You can increase to a full tablet after 4 days if you havenot developed side effects of nausea.  You may increase the  lexapro to 20 mg after 2 weeks if needed    Ok to use the alprazolam  To help  you sleep or to manage daytime anxiety Please return in  4 weeks ,  Or e mail me to let me know how it is helping your depression   For blood pressure:  Toprol Xl 25 mg once daily in AM
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
## 2017-05-29 NOTE — Assessment & Plan Note (Addendum)
With recurrence of depression,  Both reactive and aggravated by multiple significant stressors which were discussed  In detail  today. Advised him to resume  terapy with  lexapro and prn alprazolam  And strongly consider a LOA from work for one month to focus on family and court battle. . A total of 25 minutes of face to face time was spent with patient more than half of which was spent in counselling about the above mentioned conditions  and coordination of care

## 2017-05-29 NOTE — Assessment & Plan Note (Signed)
Resume medication .   Choosing  metoprolol for the calming effect.

## 2017-05-31 ENCOUNTER — Ambulatory Visit: Payer: 59 | Admitting: Internal Medicine

## 2017-06-09 ENCOUNTER — Other Ambulatory Visit: Payer: Self-pay | Admitting: Primary Care

## 2017-06-09 DIAGNOSIS — J069 Acute upper respiratory infection, unspecified: Secondary | ICD-10-CM

## 2017-06-10 ENCOUNTER — Encounter: Payer: Self-pay | Admitting: Internal Medicine

## 2017-06-14 NOTE — Telephone Encounter (Signed)
Paperwork has been printed and placed in red folder.

## 2017-06-15 DIAGNOSIS — Z0279 Encounter for issue of other medical certificate: Secondary | ICD-10-CM

## 2017-06-15 NOTE — Telephone Encounter (Signed)
Forms completed/.  Will need to be faxed to [facility] on Monday when I return form vacation.MyChart message sent

## 2017-06-17 ENCOUNTER — Telehealth: Payer: Self-pay | Admitting: Internal Medicine

## 2017-06-17 NOTE — Telephone Encounter (Signed)
Please advise 

## 2017-06-17 NOTE — Telephone Encounter (Signed)
Copied from [place] [phone]. Topic: Quick Communication - See Telephone Encounter >> Jun 17, 2017  4:43 PM Percell Belt A wrote: CRM for notification. See Telephone encounter for:  pt called in and stated that [name] is faxing over new info .  It will no be under short term disability and NOT FMLA.  She wanted to make sure she was filling out the correct papers.   Best number [phone]    06/17/17.

## 2017-06-23 ENCOUNTER — Telehealth: Payer: Self-pay | Admitting: Internal Medicine

## 2017-06-23 NOTE — Telephone Encounter (Signed)
Form completed partially,  Requires follow up appt to "return to work."  Patient notified via e mail.  Has appt march 15

## 2017-06-23 NOTE — Telephone Encounter (Signed)
See previous message from Dr. Derrel Nip.

## 2017-06-30 NOTE — Telephone Encounter (Signed)
Copied from [place] [phone]. Topic: General - Other >> Jun 29, 2017  7:37 AM Yvette Rack wrote: Reason for CRM: patient need his disability papers filed out by Friday he need it for child custody court that's on Friday and Monday all she has is same day appt and he need it in the evening time its for a f/u on disability pt need a call back as soon as possible [phone] anytime after lunch or 5 pm he will be in court

## 2017-07-01 ENCOUNTER — Ambulatory Visit: Payer: 59 | Admitting: Internal Medicine

## 2017-07-01 NOTE — Telephone Encounter (Signed)
Spoke with Travis Bennett and he wanted to give his apologies for having to cancel his appt for today. He stated that he is in the middle of a custody battle. He had rescheduled his appt for Monday and found out in court today that he will be back in court on Monday and not able to make his appt. Spoke with Dr. Derrel Nip verbally and she stated that we can work the Travis Bennett in on Tuesday at [place] is aware of appt date and time.

## 2017-07-01 NOTE — Telephone Encounter (Signed)
LMTCB. Please transfer pt to our office.  

## 2017-07-04 ENCOUNTER — Ambulatory Visit: Payer: 59 | Admitting: Internal Medicine

## 2017-07-05 ENCOUNTER — Ambulatory Visit: Payer: 59 | Admitting: Internal Medicine

## 2017-07-05 ENCOUNTER — Telehealth: Payer: Self-pay

## 2017-07-05 NOTE — Telephone Encounter (Signed)
fyi

## 2017-07-05 NOTE — Telephone Encounter (Signed)
Copied from [place] [phone]. Topic: General - Other >> Jul 05, 2017  7:43 AM Yvette Rack wrote: Reason for CRM: patient calling stating that Dr Derrel Nip wanted him to call if any problems with court and to leave a message with Janett Billow  he has court this afternoon don't know what time they will get out if he gets out on time he will come in if not he will call to setup another appt he states that he really need to see the Provider as soon as possible

## 2017-07-06 ENCOUNTER — Encounter: Payer: Self-pay | Admitting: Internal Medicine

## 2017-07-06 NOTE — Telephone Encounter (Signed)
Pt calling back to ask about an appt to see Dr Derrel Nip either Thursday or Friday.(friday is best) any appt will do Pt would like [name] to call him to advise. Hoping everything will be settled by then.

## 2017-07-06 NOTE — Telephone Encounter (Signed)
LMTCB. Please transfer pt to our office.  

## 2017-07-06 NOTE — Telephone Encounter (Signed)
Would you be willing to work pt in this Friday?

## 2017-07-06 NOTE — Telephone Encounter (Signed)
Please advise 

## 2017-07-06 NOTE — Telephone Encounter (Signed)
5:00 Friday

## 2017-07-07 ENCOUNTER — Telehealth: Payer: Self-pay

## 2017-07-07 NOTE — Telephone Encounter (Signed)
Will you please scheduled pt for Friday 07/08/2017 @ 5:00pm per Dr. Derrel Nip, it's to complete paperwork.

## 2017-07-07 NOTE — Telephone Encounter (Signed)
Dr. Derrel Nip is aware of her schedule for tomorrow afternoon and would like to go ahead with scheduling pt for 5:00pm tomorrow. Just spoke with pt and verified appt date and time. Will you please schedule pt. Thank you!

## 2017-07-07 NOTE — Telephone Encounter (Signed)
LMTCB. Please transfer pt to our office.  

## 2017-07-07 NOTE — Telephone Encounter (Signed)
Pt has been scheduled for 5:00pm on Friday

## 2017-07-07 NOTE — Telephone Encounter (Signed)
Copied from [place] [phone]. Topic: Quick Communication - See Telephone Encounter >> Jul 06, 2017  2:36 PM Adair Laundry, CMA wrote: CRM for notification. See Telephone encounter for:   07/06/17.  Please transfer pt to our office.  >> Jul 06, 2017  6:08 PM Oliver Pila B wrote: Call pt before 9:30 or b/w 12:30-2, pt would like to come in on Friday, call pt

## 2017-07-07 NOTE — Telephone Encounter (Signed)
Please see previous telephone encounters, Patient can come in on Friday which appears to be ok with Dr. Derrel Nip but I can't seem to see her schedule for Friday 07-08-17. Please advise. Thank you Copied from [place] [id number]. Topic: Quick Communication - See Telephone Encounter >> Jul 06, 2017  2:36 PM Adair Laundry, CMA wrote: CRM for notification. See Telephone encounter for:   07/06/17.  Please transfer pt to our office.  >> Jul 06, 2017  6:08 PM Oliver Pila B wrote: Call pt before 9:30 or b/w 12:30-2, pt would like to come in on Friday, call pt

## 2017-07-08 ENCOUNTER — Encounter: Payer: Self-pay | Admitting: Internal Medicine

## 2017-07-08 ENCOUNTER — Other Ambulatory Visit: Payer: Self-pay

## 2017-07-08 ENCOUNTER — Ambulatory Visit (INDEPENDENT_AMBULATORY_CARE_PROVIDER_SITE_OTHER): Payer: 59 | Admitting: Internal Medicine

## 2017-07-08 DIAGNOSIS — F411 Generalized anxiety disorder: Secondary | ICD-10-CM | POA: Diagnosis not present

## 2017-07-08 DIAGNOSIS — Z0279 Encounter for issue of other medical certificate: Secondary | ICD-10-CM

## 2017-07-08 MED ORDER — ESCITALOPRAM OXALATE 10 MG PO TABS: 10.0000 mg | ORAL_TABLET | Freq: Every day | ORAL | 1 refills | Status: DC

## 2017-07-08 NOTE — Progress Notes (Signed)
Subjective:  Patient ID: Travis Bennett, male    DOB: February 14, 1966  Age: 52 y.o. MRN: [id number]  CC: The encounter diagnosis was Generalized anxiety disorder.  HPI Travis Bennett presents for follow up on uncontrolled anxiety and depression secondary to multiple familial stressors including an ongoing custody battle for his children against his  ex wife . The court did not award him complete custody,  But the results were more favorable than his previous award.  The emotional toll from the custody fight has been severe.  He is not sleeping well , and continues to note decreased concentration and inability to complete complicated tasks.  He has been using short term disability which was approved through Monday March 24th , but he reports that he continues to feel fatigued and by mid morning  He is exhausted.  Outpatient Medications Prior to Visit  Medication Sig Dispense Refill  . ALPRAZolam (XANAX) 0.25 MG tablet Take 1 tablet (0.25 mg total) by mouth 2 (two) times daily as needed for up to 2 doses for anxiety. 60 tablet 5  . escitalopram (LEXAPRO) 10 MG tablet Take 1 tablet (10 mg total) by mouth daily. 90 tablet 1  . fluticasone (FLONASE) 50 MCG/ACT nasal spray PLACE 1 SPRAY INTO BOTH NOSTRILS 2 (TWO) TIMES DAILY 16 g 0  . metoprolol succinate (TOPROL-XL) 25 MG 24 hr tablet Take 1 tablet (25 mg total) by mouth daily. 90 tablet 3  . olopatadine (PATANOL) 0.1 % ophthalmic solution Place 1 drop into both eyes 2 (two) times daily. 5 mL 12   No facility-administered medications prior to visit.     Review of Systems;  Patient denies headache, fevers, malaise, unintentional weight loss, skin rash, eye pain, sinus congestion and sinus pain, sore throat, dysphagia,  hemoptysis , cough, dyspnea, wheezing, chest pain, palpitations, orthopnea, edema, abdominal pain, nausea, melena, diarrhea, constipation, flank pain, dysuria, hematuria, urinary  Frequency, nocturia, numbness, tingling, seizures,  Focal  weakness, Loss of consciousness,  Tremor,depression,  and suicidal ideation.      Objective:  BP 118/66 (BP Location: Left Arm, Patient Position: Sitting, Cuff Size: Normal)   Pulse 72   Temp 98.1 F (36.7 C) (Oral)   Resp 15   Ht 6' (1.829 m)   Wt 210 lb 6.4 oz (95.4 kg)   SpO2 95%   BMI 28.54 kg/m   BP Readings from Last 3 Encounters:  07/08/17 118/66  05/27/17 (!) 130/92  05/11/17 122/78    Wt Readings from Last 3 Encounters:  07/08/17 210 lb 6.4 oz (95.4 kg)  05/27/17 216 lb 6.4 oz (98.2 kg)  05/11/17 219 lb 6.4 oz (99.5 kg)    General appearance: alert, anxious, cooperative and appears stated age Neck: no adenopathy, no carotid bruit, supple, symmetrical, trachea midline and thyroid not enlarged, symmetric, no tenderness/mass/nodules Lungs: clear to auscultation bilaterally Heart: regular rate and rhythm, S1, S2 normal, no murmur, click, rub or gallop Abdomen: soft, non-tender; bowel sounds normal; no masses,  no organomegaly Pulses: 2+ and symmetric Psych: affect anxious, makes good eye contact. No fidgeting,  Smiles easily.  Denies suicidal thoughts . Speech is slightly pressured during discussion of certain topics.  Thought process is linear,  But rushed  andhe has trouble staying on topic.   Lab Results  Component Value Date   CREATININE 1.33 08/30/2016   CREATININE 1.17 02/02/2016   CREATININE 1.10 02/10/2015    Lab Results  Component Value Date   WBC 6.1 02/10/2015   HGB 15.4 02/10/2015  HCT 45.5 02/10/2015   PLT 225.0 02/10/2015   GLUCOSE 89 08/30/2016   CHOL 192 02/02/2016   TRIG 175.0 (H) 02/02/2016   HDL 43.00 02/02/2016   LDLCALC 114 (H) 02/02/2016   ALT 26 08/30/2016   AST 26 08/30/2016   NA 142 08/30/2016   K 4.4 08/30/2016   CL 104 08/30/2016   CREATININE 1.33 08/30/2016   BUN 20 08/30/2016   CO2 27 08/30/2016   TSH 2.68 02/02/2016   PSA 0.75 03/14/2013   HGBA1C 5.1 03/14/2013   MICROALBUR <0.7 08/30/2016    Patient was never  admitted.  Assessment & Plan:   Problem List Items Addressed This Visit    Generalized anxiety disorder    Triggered  by multiple domestic issues inncluding recent custody battle . He continues to have daytime fatigue and nightttime insomnia but feels that his symptoms have improved somewhat since the custidy veridct has been received.  He has been out of work for 6weeks,  I am recommending that he return to work at part time for the next 2 weeks.          I am having Ike Bene maintain his olopatadine, ALPRAZolam, metoprolol succinate, fluticasone, and escitalopram.  No orders of the defined types were placed in this encounter.   There are no discontinued medications.  Follow-up: No follow-ups on file.   Crecencio Mc, MD
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
## 2017-08-04 DIAGNOSIS — L821 Other seborrheic keratosis: Secondary | ICD-10-CM | POA: Diagnosis not present

## 2017-08-04 DIAGNOSIS — D2372 Other benign neoplasm of skin of left lower limb, including hip: Secondary | ICD-10-CM | POA: Diagnosis not present

## 2017-12-01 ENCOUNTER — Other Ambulatory Visit: Payer: Self-pay | Admitting: Internal Medicine

## 2017-12-01 DIAGNOSIS — N5089 Other specified disorders of the male genital organs: Secondary | ICD-10-CM

## 2017-12-01 NOTE — Progress Notes (Signed)
amb ref to urology ordered for self exam testicular mass

## 2017-12-08 ENCOUNTER — Encounter: Payer: Self-pay | Admitting: Urology

## 2017-12-08 ENCOUNTER — Ambulatory Visit (INDEPENDENT_AMBULATORY_CARE_PROVIDER_SITE_OTHER): Payer: 59 | Admitting: Urology

## 2017-12-08 ENCOUNTER — Ambulatory Visit
Admission: RE | Admit: 2017-12-08 | Discharge: 2017-12-08 | Disposition: A | Discharge status: Home / Self Care | Payer: 59 | Source: Ambulatory Visit | Attending: Urology | Admitting: Urology

## 2017-12-08 VITALS — BP 150/92 | HR 83 | Ht 72.0 in | Wt 214.8 lb

## 2017-12-08 DIAGNOSIS — N509 Disorder of male genital organs, unspecified: Secondary | ICD-10-CM

## 2017-12-08 DIAGNOSIS — N5089 Other specified disorders of the male genital organs: Secondary | ICD-10-CM

## 2017-12-08 DIAGNOSIS — I861 Scrotal varices: Secondary | ICD-10-CM | POA: Diagnosis not present

## 2017-12-08 NOTE — Progress Notes (Addendum)
12/08/2017 3:24 PM   Travis Bennett Dec 29, 1965 [id number]  Referring provider: Crecencio Mc, MD [address] [place], [place] [address]  CC: Right scrotal mass  HPI: I had the pleasure of seeing Travis Bennett in urology clinic today for question of right scrotal mass.  Briefly he is a 53 year old male with past medical history notable for anxiety and depression who felt a right sided scrotal mass over the last few weeks in the shower on self-exam.  It is nontender.  He denies any urinary symptoms, weight loss, bone pain, hematuria.  No family history of testicular cancer.  There are no aggravating or alleviating factors.  Duration is 2 to 4 weeks.  Size is approximately 1 cm.   PMH: Past Medical History:  Diagnosis Date  . Blood in stool   . Chicken pox   . Chronic headaches   . Depression   . Irritable bowel syndrome     Surgical History: Past Surgical History:  Procedure Laterality Date  . ANAL FISTULECTOMY  1996  . ANAL FISTULECTOMY N/A 1995  . TONSILLECTOMY AND ADENOIDECTOMY  1970    Allergies: No Known Allergies  Family History: Family History  Problem Relation Age of Onset  . Hyperlipidemia Mother   . Cancer Maternal Grandmother        Ovarian  . Alcohol abuse Maternal Grandmother   . Alzheimer's disease Paternal Grandmother   . Cancer Paternal Grandfather        colon  . Heart disease Paternal Grandfather   . Cancer Father        throat Ca,  tobacco   . Heart disease Maternal Grandfather 52       AMI   . Early death Maternal Aunt   . Prostate cancer Neg Hx   . Bladder Cancer Neg Hx   . Kidney cancer Neg Hx     Social History:  reports that he has never smoked. He has never used smokeless tobacco. He reports that he drinks about 7.0 standard drinks of alcohol per week. He reports that he does not use drugs.  ROS: Please see flowsheet from today's date for complete review of systems.  Physical Exam: BP (!) 150/92 (BP Location: Left  Arm, Patient Position: Sitting, Cuff Size: Normal)   Pulse 83   Ht 6' (1.829 m)   Wt 214 lb 12.8 oz (97.4 kg)   BMI 29.13 kg/m    Constitutional:  Alert and oriented, No acute distress. Cardiovascular: No clubbing, cyanosis, or edema. Respiratory: Normal respiratory effort, no increased work of breathing. GI: Abdomen is soft, nontender, nondistended, no abdominal masses GU: No CVA tenderness, phallus without lesions, widely patent meatus, testicles descended bilaterally, 20 cc, no testicular masses on exam.  There is a left varicocele. Right side notable for possible enlarged epididymis versus large right varicocele. Lymph: No cervical or inguinal lymphadenopathy. Skin: No rashes, bruises or suspicious lesions. Neurologic: Grossly intact, no focal deficits, moving all 4 extremities. Psychiatric: Normal mood and affect.  Laboratory Data: None to review  Pertinent Imaging: None to review  Assessment & Plan:   In summary, Travis Bennett is a 52 year old healthy man with history of anxiety and depression who presents for evaluation of a right sided scrotal mass.  On exam, I do not feel any discrete testicular lesions, however there is a large right varicocele versus enlarged epididymis.   Plan: Scrotal ultrasound today. If right-sided varicocele, will follow-up with bilateral renal ultrasound.  I will call him with the  results of his scrotal ultrasound, and determine follow-up at that time  ADDENDUM: Scrotal ultrasound shows R>L varicoceles. No testicular masses. I discussed over the phone with patient this evening, will plan for CT A/P w/contrast within 24-48 hours to rule out retroperitoneal pathology. CT A/P ordered STAT, will discuss with schedulers in [place], [place] [address] [place], [place] [address] [phone]

## 2017-12-08 NOTE — Addendum Note (Signed)
Addended by: Billey Co on: 12/08/2017 06:33 PM   Modules accepted: Orders

## 2017-12-09 ENCOUNTER — Telehealth: Payer: Self-pay | Admitting: Urology

## 2017-12-09 ENCOUNTER — Ambulatory Visit
Admission: RE | Admit: 2017-12-09 | Discharge: 2017-12-09 | Disposition: A | Discharge status: Home / Self Care | Payer: 59 | Source: Ambulatory Visit | Attending: Urology | Admitting: Urology

## 2017-12-09 DIAGNOSIS — N281 Cyst of kidney, acquired: Secondary | ICD-10-CM | POA: Insufficient documentation

## 2017-12-09 DIAGNOSIS — I861 Scrotal varices: Secondary | ICD-10-CM | POA: Diagnosis not present

## 2017-12-09 MED ORDER — IOPAMIDOL (ISOVUE-300) INJECTION 61%
100.0000 mL | Freq: Once | INTRAVENOUS | Status: AC | PRN
  Administered 2017-12-09: 100 mL via INTRAVENOUS

## 2017-12-09 NOTE — Telephone Encounter (Signed)
Discussed CT findings with patient this afternoon.  Grossly normal, no abdominal pathology. Simple left renal cyst.  Follow up PRN  Billey Co, MD 12/09/2017

## 2017-12-09 NOTE — Telephone Encounter (Signed)
App has been made ° ° °Travis Bennett °

## 2017-12-09 NOTE — Telephone Encounter (Signed)
-----   Message from Billey Co, MD sent at 12/08/2017  6:31 PM EDT ----- Regarding: CT stat Please facilitate STAT CT A/P w/contrast for Mr. Rodenberg Friday 8/23. . I let him know this is the plan after bilateral large varicoceles were seen on scrotal u/s today. CT is ordered.  Thanks, Billey Co, MD 12/08/2017

## 2018-01-20 DIAGNOSIS — Z23 Encounter for immunization: Secondary | ICD-10-CM | POA: Diagnosis not present

## 2018-03-10 ENCOUNTER — Ambulatory Visit: Payer: Self-pay | Admitting: *Deleted

## 2018-03-10 NOTE — Telephone Encounter (Signed)
Pt reports had BP checked today at local pharmacy: 175/105.  States he has not been taking his BP medication Dr. Derrel Nip ordered "Actually I have never taken it." States has picked up medication today and will start this evening. Reports occasional headache, not presently,states arms tingling at times "But I've recently starting lifting weights and that happens at times."  Denies any blurred vision, weakness. States work load has been heavy and traveling a lot, but is on vacation next week. States he knows all the things he needs to do to lower his BP and will start doing so. Offered appt with Dr. Derrel Nip, declined but states will come in if needed. Instructed to check BP over weekend, early week and CB to report. Made aware [place] would alert Dr. Derrel Nip and if additional instructions will hear from practice. Care advise given per protocol.   Reason for Disposition . Systolic BP  >= 827 OR Diastolic >= 078  Answer Assessment - Initial Assessment Questions 1. BLOOD PRESSURE: "What is the blood pressure?" "Did you take at least two measurements 5 minutes apart?"     No Took earlier at pharmacy 2. ONSET: "When did you take your blood pressure?"     175/105  at  1530 3. HOW: "How did you obtain the blood pressure?" (e.g., visiting nurse, automatic home BP monitor)     Drug store 4. HISTORY: "Do you have a history of high blood pressure?"     yes 5. MEDICATIONS: "Are you taking any medications for blood pressure?" "Have you missed any doses recently?"     Did not start ordered medication 6. OTHER SYMPTOMS: "Do you have any symptoms?" (e.g., headache, chest pain, blurred vision, difficulty breathing, weakness)     Little tingling arms but has been lifting weights. Occasional headache  Protocols used: HIGH BLOOD PRESSURE-A-AH

## 2018-03-10 NOTE — Telephone Encounter (Signed)
FYI

## 2018-06-10 ENCOUNTER — Other Ambulatory Visit: Payer: Self-pay | Admitting: Internal Medicine

## 2018-07-06 ENCOUNTER — Ambulatory Visit: Payer: Self-pay

## 2018-07-06 NOTE — Telephone Encounter (Signed)
  Patient called in with c/o "fever, cough." He says "since Monday, I haven't felt my best, feeling tired, cough. My temperature today was 101.7. I've been having sinus congestion and taking OTC Mucinex multi-symptom and sudafed. I don't have a sore throat, no runny nose." I asked about travel, exposure, he says "I haven't traveled since the end of February, I went to [place]. I haven't been anywhere but around here, no exposure to anyone sick or to anyone with the coronavirus." According to protocol, see PCP within 24 hours, appointment scheduled for tomorrow at 1600 with Dr. Derrel Nip, care advice given, patient verbalized understanding.   Reason for Disposition . Fever present > 3 days (72 hours)  Answer Assessment - Initial Assessment Questions 1. TEMPERATURE: "What is the most recent temperature?"  "How was it measured?"      101.7 2. ONSET: "When did the fever start?"      Today 3. SYMPTOMS: "Do you have any other symptoms besides the fever?"  (e.g., colds, headache, sore throat, earache, cough, rash, diarrhea, vomiting, abdominal pain)     Cough, sinus congestion, tired 4. CAUSE: If there are no symptoms, ask: "What do you think is causing the fever?"      No idea 5. CONTACTS: "Does anyone else in the family have an infection?"     No 6. TREATMENT: "What have you done so far to treat this fever?" (e.g., medications)     Mucinex liquid multisymptom and sudafed 7. IMMUNOCOMPROMISE: "Do you have of the following: diabetes, HIV positive, splenectomy, cancer chemotherapy, chronic steroid treatment, transplant patient, etc."     No 8. PREGNANCY: "Is there any chance you are pregnant?" "When was your last menstrual period?"     N/A 9. TRAVEL: "Have you traveled out of the country in the last month?" (e.g., travel history, exposures)     No  Protocols used: FEVER-A-AH

## 2018-07-07 ENCOUNTER — Telehealth: Payer: Self-pay | Admitting: Internal Medicine

## 2018-07-07 ENCOUNTER — Other Ambulatory Visit: Payer: Self-pay

## 2018-07-07 ENCOUNTER — Encounter: Payer: Self-pay | Admitting: Internal Medicine

## 2018-07-07 ENCOUNTER — Encounter: Payer: Self-pay | Admitting: *Deleted

## 2018-07-07 ENCOUNTER — Ambulatory Visit: Payer: 59 | Admitting: Internal Medicine

## 2018-07-07 DIAGNOSIS — J22 Unspecified acute lower respiratory infection: Secondary | ICD-10-CM

## 2018-07-07 NOTE — Telephone Encounter (Signed)
Paperwork for Covid testing sent by my chart.

## 2018-07-07 NOTE — Telephone Encounter (Signed)
Lab ordered and mychart messages sent to patient

## 2018-07-10 ENCOUNTER — Ambulatory Visit: Payer: Self-pay

## 2018-07-10 NOTE — Telephone Encounter (Signed)
Pt with questions about how to treat fever. Pt's temp 101 -102.3 with occasional chills and sweating. Pt Coronavirus test pending. Care advice given and pt verbalized understanding.  Reason for Disposition . [1] Fever AND [2] no signs of serious infection or localizing symptoms (all other triage questions negative)  Answer Assessment - Initial Assessment Questions 1. TEMPERATURE: "What is the most recent temperature?"  "How was it measured?"      101.5 last night 102.3 2. ONSET: "When did the fever start?"      Thursday 3. SYMPTOMS: "Do you have any other symptoms besides the fever?"  (e.g., colds, headache, sore throat, earache, cough, rash, diarrhea, vomiting, abdominal pain)     Cough wheezing , head congestion, 4. CAUSE: If there are no symptoms, ask: "What do you think is causing the fever?"      Suspected Corona tests pending tested on Friday 5. CONTACTS: "Does anyone else in the family have an infection?"     Slight cough no fever 6. TREATMENT: "What have you done so far to treat this fever?" (e.g., medications)     Tylenol and Ibuprofen every 4-6 hours 7. IMMUNOCOMPROMISE: "Do you have of the following: diabetes, HIV positive, splenectomy, cancer chemotherapy, chronic steroid treatment, transplant patient, etc."     no 8. PREGNANCY: "Is there any chance you are pregnant?" "When was your last menstrual period?"     no 9. TRAVEL: "Have you traveled out of the country in the last month?" (e.g., travel history, exposures)     No travel since 2/26 the [place] a day or two  Protocols used: FEVER-A-AH

## 2018-07-12 ENCOUNTER — Ambulatory Visit: Payer: Self-pay | Admitting: *Deleted

## 2018-07-12 NOTE — Telephone Encounter (Signed)
Patient requesting results of Covid-19 test performed on 07/07/18 at [facility]. Informed no results available as of yet.

## 2018-07-13 ENCOUNTER — Ambulatory Visit: Payer: Self-pay | Admitting: *Deleted

## 2018-07-13 NOTE — Telephone Encounter (Signed)
Pt reports had just received message from [place] questioning how he and his wife were doing.  Also requesting results of COVID-19.  Pt states afebrile all day yesterday at 98.0, without tylenol or Ibprofen. States this am when first awakened temp. 100.8;  Prior to call 98.0.  States he continues with cough, wheezing at times. States wife afebrile with occasional cough. Also questioning if results were back. I did tell pt results were pending. Will not disclose when resulted, made aware he will receive call from practice.

## 2018-07-13 NOTE — Telephone Encounter (Signed)
Spoke with pt to see how he was doing and to let him know that we are still waiting on the test results to come. The pt stated that yesterday his fever finally broke and has been fever free today as well. He stated that he still feels tired but not as bad as a few days ago and he does still have a little cough but isn't sure if that is allergy related.

## 2018-07-13 NOTE — Telephone Encounter (Signed)
That's great news.  He needs to be fever free for 72 hours WITHOUT the use of tylenol , motrin or aleve before he can come out of quarantine

## 2018-07-14 ENCOUNTER — Telehealth: Payer: Self-pay | Admitting: Internal Medicine

## 2018-07-14 NOTE — Telephone Encounter (Signed)
Pt. Called to inquire about results of the COVID 19 test.  Informed that the results are not back.  Pt. verb. Understanding.  Stated he will call back on Monday, 3/30.

## 2018-07-16 ENCOUNTER — Encounter: Payer: Self-pay | Admitting: Internal Medicine

## 2018-07-17 NOTE — Telephone Encounter (Signed)
Spoke with Dr. Derrel Nip verbally and she stated that she has spoken with the pt in regards to his covid test result.

## 2018-07-19 LAB — NOVEL CORONAVIRUS, NAA: SARS-COV-2, NAA: DETECTED — AB

## 2018-08-17 ENCOUNTER — Telehealth: Payer: Self-pay

## 2018-08-17 MED ORDER — ESCITALOPRAM OXALATE 20 MG PO TABS: 20.0000 mg | ORAL_TABLET | Freq: Every day | ORAL | 0 refills | Status: DC

## 2018-08-17 NOTE — Telephone Encounter (Signed)
lexapro refilled.  Please schedule patient for a virtual visit in 2-4 weeks

## 2018-08-17 NOTE — Telephone Encounter (Signed)
Pt has been scheduled for a follow up appt. Pt is aware of appt date and time.

## 2018-08-17 NOTE — Telephone Encounter (Signed)
Received a refill request from [facility] for a refill on Escitalopram 20mg  take one tablet once daily. This medication is not in the pt's current medication list. Spoke with pt and he stated that he stopped taking the Lexapro for about 9 months but has decided that he needs to start back on it. Pt stated that "with everything going on, Covid-19, 4 kids being at home trying to do homework, wife at home, financial, everything being turned upside down" has caused his depression and anxiety to increase. Pt stated that he was on this for many years before he came off of it about 9 months ago.

## 2018-09-07 ENCOUNTER — Ambulatory Visit (INDEPENDENT_AMBULATORY_CARE_PROVIDER_SITE_OTHER): Payer: 59 | Admitting: Internal Medicine

## 2018-09-07 DIAGNOSIS — F418 Other specified anxiety disorders: Secondary | ICD-10-CM

## 2018-09-07 DIAGNOSIS — J40 Bronchitis, not specified as acute or chronic: Secondary | ICD-10-CM | POA: Diagnosis not present

## 2018-09-07 DIAGNOSIS — U071 COVID-19: Secondary | ICD-10-CM

## 2018-09-07 DIAGNOSIS — N522 Drug-induced erectile dysfunction: Secondary | ICD-10-CM

## 2018-09-07 MED ORDER — ALPRAZOLAM 0.25 MG PO TABS: 0.2500 mg | ORAL_TABLET | Freq: Two times a day (BID) | ORAL | 5 refills | Status: DC | PRN

## 2018-09-07 MED ORDER — BUPROPION HCL ER (XL) 150 MG PO TB24: 150.0000 mg | ORAL_TABLET | Freq: Every day | ORAL | 5 refills | Status: DC

## 2018-09-07 NOTE — Assessment & Plan Note (Signed)
Symptoms were mild and resolved without hospitalization

## 2018-09-07 NOTE — Progress Notes (Signed)
Virtual Visit via [email or web address]  This visit type was conducted due to national recommendations for restrictions regarding the COVID-19 pandemic (e.g. social distancing).  This format is felt to be most appropriate for this patient at this time.  All issues noted in this document were discussed and addressed.  No physical exam was performed (except for noted visual exam findings with Video Visits).   I connected with@ on 09/07/18 at 12:00 PM EDT by a video enabled telemedicine application or telephone and verified that I am speaking with the correct person using two identifiers. Location patient: home Location provider: work or home office Persons participating in the virtual visit: patient, provider  I discussed the limitations, risks, security and privacy concerns of performing an evaluation and management service by telephone and the availability of in person appointments. I also discussed with the patient that there may be a patient responsible charge related to this service. The patient expressed understanding and agreed to proceed.  Reason for visit: worsening anxiety, recovery from COVID 2  HPI:  53 yr old male diagnosed with COVID 27 on March 20 after presenting with fever cough and URI symptoms.  Last known travel was to [place] on or around Feb 27. He self quarantined along with his wife, who tested negative for the antigen and more recently  The antibody.    Patient  has fully recovered from the infection but continues to have neagative psychiatric issues including  difficulty concentrating, fatigue, and weight gain due to eating poorly and not exercising. Marland Kitchen  He is taking Lexapro and has developed the side effect of ED and failure to climax, which did not happen when he was taking lopressor alone   ROS: See pertinent positives and negatives per HPI.  Past Medical History:  Diagnosis Date  . Blood in stool   . Chicken pox   . Chronic headaches   . Depression   . Irritable bowel syndrome      Past Surgical History:  Procedure Laterality Date  . ANAL FISTULECTOMY  1996  . ANAL FISTULECTOMY N/A 1995  . TONSILLECTOMY AND ADENOIDECTOMY  1970    Family History  Problem Relation Age of Onset  . Hyperlipidemia Mother   . Cancer Maternal Grandmother        Ovarian  . Alcohol abuse Maternal Grandmother   . Alzheimer's disease Paternal Grandmother   . Cancer Paternal Grandfather        colon  . Heart disease Paternal Grandfather   . Cancer Father        throat Ca,  tobacco   . Heart disease Maternal Grandfather 68       AMI   . Early death Maternal Aunt   . Prostate cancer Neg Hx   . Bladder Cancer Neg Hx   . Kidney cancer Neg Hx     SOCIAL HX:  Social History   Tobacco Use  . Smoking status: Never Smoker  . Smokeless tobacco: Never Used  Substance Use Topics  . Alcohol use: Yes    Alcohol/week: 7.0 standard drinks    Types: 7 Standard drinks or equivalent per week  . Drug use: No     Current Outpatient Medications:  .  fluticasone (FLONASE) 50 MCG/ACT nasal spray, PLACE 1 SPRAY INTO BOTH NOSTRILS 2 (TWO) TIMES DAILY, Disp: 16 g, Rfl: 0 .  metoprolol succinate (TOPROL-XL) 25 MG 24 hr tablet, TAKE 1 TABLET BY MOUTH DAILY, Disp: 90 tablet, Rfl: 3 .  olopatadine (PATANOL) 0.1 % ophthalmic solution,  Place 1 drop into both eyes 2 (two) times daily., Disp: 5 mL, Rfl: 12 .  ALPRAZolam (XANAX) 0.25 MG tablet, Take 1 tablet (0.25 mg total) by mouth 2 (two) times daily as needed for up to 2 doses for anxiety., Disp: 60 tablet, Rfl: 5 .  buPROPion (WELLBUTRIN XL) 150 MG 24 hr tablet, Take 1 tablet (150 mg total) by mouth daily., Disp: 30 tablet, Rfl: 5  EXAM:  VITALS per patient if applicable:  GENERAL: alert, oriented, appears well and in no acute distress  HEENT: atraumatic, conjunttiva clear, no obvious abnormalities on inspection of external nose and ears  NECK: normal movements of the head and neck  LUNGS: on inspection no signs of respiratory distress,  breathing rate appears normal, no obvious gross SOB, gasping or wheezing  CV: no obvious cyanosis  MS: moves all visible extremities without noticeable abnormality  PSYCH/NEURO: pleasant and cooperative, no obvious depression or anxiety, speech and thought processing grossly intact  ASSESSMENT AND PLAN:  Discussed the following assessment and plan:  Bronchitis due to COVID-19 virus  Anxiety with depression  Drug-induced erectile dysfunction  Bronchitis due to COVID-19 virus Symptoms were mild and resolved without hospitalization   Anxiety with depression No longer tolerating lexapro due to multiple untreated symptomsincluding lack of motivation,  Poor concentration,  And overeating ; complicated by concurrent ED.  Trial of wellbutrin XR , wean lexapro over 2 weeks and add  prn alprazolam given history of anxiety.   Drug-induced erectile dysfunction Due to combination of lexapro and lopressor.  will wean off lexapro and start wellbutrin     I discussed the assessment and treatment plan with the patient. The patient was provided an opportunity to ask questions and all were answered. The patient agreed with the plan and demonstrated an understanding of the instructions.   The patient was advised to call back or seek an in-person evaluation if the symptoms worsen or if the condition fails to improve as anticipated.  I provided 25 minutes of non-face-to-face time during this encounter.   Crecencio Mc, MD

## 2018-09-07 NOTE — Patient Instructions (Signed)
Continue Toprol XL.  You can change administration to bedtime ,  As this has been shown to reduce heart attacks and strokes.   Continue lexapro for one week.  If you have additional tablets remaining,  Take it every other day for Week 2 and then stop completely   Start wellbutrin tomorrow .  Take it once daily in the morning.  Use the alprazolam sparingly,  For daytime anxiety or insomnia (maximum 2 tablets daily)   Follow up via mychart in 2-3 weeks

## 2018-09-10 ENCOUNTER — Encounter: Payer: Self-pay | Admitting: Internal Medicine

## 2018-09-10 DIAGNOSIS — N522 Drug-induced erectile dysfunction: Secondary | ICD-10-CM | POA: Insufficient documentation

## 2018-09-10 DIAGNOSIS — N529 Male erectile dysfunction, unspecified: Secondary | ICD-10-CM | POA: Insufficient documentation

## 2018-09-10 NOTE — Assessment & Plan Note (Signed)
Due to combination of lexapro and lopressor.  will wean off lexapro and start wellbutrin

## 2018-09-10 NOTE — Assessment & Plan Note (Addendum)
No longer tolerating lexapro due to multiple untreated symptomsincluding lack of motivation,  Poor concentration,  And overeating ; complicated by concurrent ED.  Trial of wellbutrin XR , wean lexapro over 2 weeks and add  prn alprazolam given history of anxiety.

## 2018-10-13 ENCOUNTER — Ambulatory Visit: Payer: Self-pay | Admitting: Internal Medicine

## 2018-10-13 NOTE — Telephone Encounter (Signed)
Pt. Reports he was diagnosed with COVID 19 in March and his cough has never gone way. "Seems to be deeper." Non-productive, no fever or shortness of breath. Concerned he may need a chest x-ray. Warm transfer to [facility] in the practice for a virtual visit.  Answer Assessment - Initial Assessment Questions 1. ONSET: "When did the cough begin?"      March 2. SEVERITY: "How bad is the cough today?"      Mild 3. RESPIRATORY DISTRESS: "Describe your breathing."      No distress 4. FEVER: "Do you have a fever?" If so, ask: "What is your temperature, how was it measured, and when did it start?"     No 5. HEMOPTYSIS: "Are you coughing up any blood?" If so ask: "How much?" (flecks, streaks, tablespoons, etc.)     No 6. TREATMENT: "What have you done so far to treat the cough?" (e.g., meds, fluids, humidifier)     No 7. CARDIAC HISTORY: "Do you have any history of heart disease?" (e.g., heart attack, congestive heart failure)      No 8. LUNG HISTORY: "Do you have any history of lung disease?"  (e.g., pulmonary embolus, asthma, emphysema)     No 9. PE RISK FACTORS: "Do you have a history of blood clots?" (or: recent major surgery, recent prolonged travel, bedridden)     No 10. OTHER SYMPTOMS: "Do you have any other symptoms? (e.g., runny nose, wheezing, chest pain)       Wheezing on and off 11. PREGNANCY: "Is there any chance you are pregnant?" "When was your last menstrual period?"       n/a 12. TRAVEL: "Have you traveled out of the country in the last month?" (e.g., travel history, exposures)       No  Protocols used: COUGH - ACUTE NON-PRODUCTIVE-A-AH

## 2018-10-13 NOTE — Telephone Encounter (Signed)
Virtual visit needed before I can order anything

## 2018-10-16 ENCOUNTER — Telehealth: Payer: Self-pay

## 2018-10-16 ENCOUNTER — Ambulatory Visit (INDEPENDENT_AMBULATORY_CARE_PROVIDER_SITE_OTHER): Payer: 59 | Admitting: Internal Medicine

## 2018-10-16 ENCOUNTER — Other Ambulatory Visit: Payer: Self-pay

## 2018-10-16 ENCOUNTER — Encounter: Payer: Self-pay | Admitting: Internal Medicine

## 2018-10-16 DIAGNOSIS — R05 Cough: Secondary | ICD-10-CM

## 2018-10-16 DIAGNOSIS — U071 COVID-19: Secondary | ICD-10-CM | POA: Diagnosis not present

## 2018-10-16 DIAGNOSIS — I10 Essential (primary) hypertension: Secondary | ICD-10-CM | POA: Diagnosis not present

## 2018-10-16 DIAGNOSIS — Z125 Encounter for screening for malignant neoplasm of prostate: Secondary | ICD-10-CM

## 2018-10-16 DIAGNOSIS — R058 Other specified cough: Secondary | ICD-10-CM

## 2018-10-16 DIAGNOSIS — J40 Bronchitis, not specified as acute or chronic: Secondary | ICD-10-CM

## 2018-10-16 DIAGNOSIS — Z8371 Family history of colonic polyps: Secondary | ICD-10-CM

## 2018-10-16 MED ORDER — BUDESONIDE-FORMOTEROL FUMARATE 80-4.5 MCG/ACT IN AERO: 2.0000 | INHALATION_SPRAY | Freq: Two times a day (BID) | RESPIRATORY_TRACT | 3 refills | Status: DC

## 2018-10-16 NOTE — Assessment & Plan Note (Addendum)
He has a persistent dry cough since his illness in March.  chest x ray and Symbicort MDI ordered .  Given his concurrent sinus drainage ,  Will add antibiotic therapy and prednisone taper pending review of chest x ray

## 2018-10-16 NOTE — Telephone Encounter (Signed)
LMTCB. Need to schedule pt for a chest xray and fasting lab appt.

## 2018-10-16 NOTE — Progress Notes (Signed)
Virtual Visit via [email or web address]  This visit type was conducted due to national recommendations for restrictions regarding the COVID-19 pandemic (e.g. social distancing).  This format is felt to be most appropriate for this patient at this time.  All issues noted in this document were discussed and addressed.  No physical exam was performed (except for noted visual exam findings with Video Visits).   I connected with@ on  at  9:30 AM EDT by a video enabled telemedicine application and verified that I am speaking with the correct person using two identifiers. Location patient: home Location provider: work or home office Persons participating in the virtual visit: patient, provider  I discussed the limitations, risks, security and privacy concerns of performing an evaluation and management service by telephone and the availability of in person appointments. I also discussed with the patient that there may be a patient responsible charge related to this service. The patient expressed understanding and agreed to proceed.  Reason for visit: persistent cough  HPI:  53 yr old male infected with COVID [place],  Had an uneventful recovery at home , but has  Had a persistent intermittent dry cough since his infection.  Describes it as a tickle at the base of throat , not increased with exertion or supine position .  Occasional wheezing and chest tightness.  Has had some sinus congestion with brownish discharge but without fevers or facial pain.   Need for colonoscopy:  Father's history of >20 polyps    ROS: See pertinent positives and negatives per HPI.  Past Medical History:  Diagnosis Date  . Blood in stool   . Chicken pox   . Chronic headaches   . Depression   . Irritable bowel syndrome     Past Surgical History:  Procedure Laterality Date  . ANAL FISTULECTOMY  1996  . ANAL FISTULECTOMY N/A 1995  . TONSILLECTOMY AND ADENOIDECTOMY  1970    Family History  Problem Relation Age of Onset  .  Hyperlipidemia Mother   . Cancer Maternal Grandmother        Ovarian  . Alcohol abuse Maternal Grandmother   . Alzheimer's disease Paternal Grandmother   . Cancer Paternal Grandfather        colon  . Heart disease Paternal Grandfather   . Cancer Father        throat Ca,  tobacco   . Heart disease Maternal Grandfather 36       AMI   . Early death Maternal Aunt   . Prostate cancer Neg Hx   . Bladder Cancer Neg Hx   . Kidney cancer Neg Hx     SOCIAL HX:  reports that he has never smoked. He has never used smokeless tobacco. He reports current alcohol use of about 7.0 standard drinks of alcohol per week. He reports that he does not use drugs.   Current Outpatient Medications:  .  buPROPion (WELLBUTRIN XL) 150 MG 24 hr tablet, Take 1 tablet (150 mg total) by mouth daily., Disp: 30 tablet, Rfl: 5 .  fluticasone (FLONASE) 50 MCG/ACT nasal spray, PLACE 1 SPRAY INTO BOTH NOSTRILS 2 (TWO) TIMES DAILY, Disp: 16 g, Rfl: 0 .  metoprolol succinate (TOPROL-XL) 25 MG 24 hr tablet, TAKE 1 TABLET BY MOUTH DAILY, Disp: 90 tablet, Rfl: 3 .  olopatadine (PATANOL) 0.1 % ophthalmic solution, Place 1 drop into both eyes 2 (two) times daily., Disp: 5 mL, Rfl: 12 .  ALPRAZolam (XANAX) 0.25 MG tablet, Take 1 tablet (0.25 mg  total) by mouth 2 (two) times daily as needed for up to 2 doses for anxiety. (Patient not taking: Reported on 10/16/2018), Disp: 60 tablet, Rfl: 5 .  budesonide-formoterol (SYMBICORT) 80-4.5 MCG/ACT inhaler, Inhale 2 puffs into the lungs 2 (two) times daily. RINSE MOUTH AFTER EACH USE, Disp: 1 Inhaler, Rfl: 3  EXAM:  VITALS per patient if applicable:  GENERAL: alert, oriented, appears well and in no acute distress  HEENT: atraumatic, conjunttiva clear, no obvious abnormalities on inspection of external nose and ears  NECK: normal movements of the head and neck  LUNGS: on inspection no signs of respiratory distress, breathing rate appears normal, no obvious gross SOB, gasping or  wheezing  CV: no obvious cyanosis  MS: moves all visible extremities without noticeable abnormality  PSYCH/NEURO: pleasant and cooperative, no obvious depression or anxiety, speech and thought processing grossly intact  ASSESSMENT AND PLAN:  Discussed the following assessment and plan: Bronchitis due to COVID-19 virus He has a persistent dry cough since his illness in March.  chest x ray and Symbicort MDI ordered .  Given his concurrent sinus drainage ,  Will add antibiotic therapy and prednisone taper pending review of chest x ray   Family history of colonic polyps 5 yr follow up colonoscopy due; referral to [facility]    I discussed the assessment and treatment plan with the patient. The patient was provided an opportunity to ask questions and all were answered. The patient agreed with the plan and demonstrated an understanding of the instructions.   The patient was advised to call back or seek an in-person evaluation if the symptoms worsen or if the condition fails to improve as anticipated.  I provided 25 minutes of non-face-to-face time during this encounter.   Crecencio Mc, MD

## 2018-10-16 NOTE — Assessment & Plan Note (Signed)
5 yr follow up colonoscopy due; referral to [facility]

## 2018-10-16 NOTE — Patient Instructions (Signed)
[name] WILL CALL YOU WITH A LAB AND FASTING LAB APPT.  I HAVE Called in symbicort to use 2 puffs twice daily .  Rinse mouth after use.  Also calling in antibotic once I see your chest x ray  For now use saline irrigation for sinuses,  And sudafed PE up to 30 mg every 6 hours if tolerated and not causing BP to ruse above 813 systolic

## 2018-10-18 NOTE — Telephone Encounter (Signed)
Pt called back and is scheduled

## 2018-10-19 ENCOUNTER — Encounter: Payer: Self-pay | Admitting: Emergency Medicine

## 2018-10-19 ENCOUNTER — Emergency Department
Admission: EM | Admit: 2018-10-19 | Discharge: 2018-10-19 | Disposition: A | Discharge status: Home / Self Care | Payer: 59 | Attending: Emergency Medicine | Admitting: Emergency Medicine

## 2018-10-19 ENCOUNTER — Other Ambulatory Visit (INDEPENDENT_AMBULATORY_CARE_PROVIDER_SITE_OTHER): Payer: 59

## 2018-10-19 ENCOUNTER — Other Ambulatory Visit: Payer: Self-pay

## 2018-10-19 ENCOUNTER — Telehealth: Payer: Self-pay | Admitting: Internal Medicine

## 2018-10-19 ENCOUNTER — Ambulatory Visit: Payer: 59 | Admitting: Internal Medicine

## 2018-10-19 ENCOUNTER — Ambulatory Visit (INDEPENDENT_AMBULATORY_CARE_PROVIDER_SITE_OTHER): Payer: 59

## 2018-10-19 DIAGNOSIS — I10 Essential (primary) hypertension: Secondary | ICD-10-CM | POA: Diagnosis not present

## 2018-10-19 DIAGNOSIS — Y92410 Unspecified street and highway as the place of occurrence of the external cause: Secondary | ICD-10-CM | POA: Diagnosis not present

## 2018-10-19 DIAGNOSIS — R05 Cough: Secondary | ICD-10-CM

## 2018-10-19 DIAGNOSIS — G44319 Acute post-traumatic headache, not intractable: Secondary | ICD-10-CM | POA: Diagnosis not present

## 2018-10-19 DIAGNOSIS — R058 Other specified cough: Secondary | ICD-10-CM

## 2018-10-19 DIAGNOSIS — S161XXA Strain of muscle, fascia and tendon at neck level, initial encounter: Secondary | ICD-10-CM | POA: Diagnosis not present

## 2018-10-19 DIAGNOSIS — Y9389 Activity, other specified: Secondary | ICD-10-CM | POA: Diagnosis not present

## 2018-10-19 DIAGNOSIS — Z79899 Other long term (current) drug therapy: Secondary | ICD-10-CM | POA: Insufficient documentation

## 2018-10-19 DIAGNOSIS — Z125 Encounter for screening for malignant neoplasm of prostate: Secondary | ICD-10-CM

## 2018-10-19 DIAGNOSIS — Y999 Unspecified external cause status: Secondary | ICD-10-CM | POA: Diagnosis not present

## 2018-10-19 DIAGNOSIS — R51 Headache: Secondary | ICD-10-CM | POA: Diagnosis present

## 2018-10-19 LAB — LIPID PANEL
Cholesterol: 186 mg/dL (ref 0–200)
HDL: 37.7 mg/dL — ABNORMAL LOW (ref >39.00)
LDL Cholesterol: 115 mg/dL — ABNORMAL HIGH (ref 0–99)
NonHDL: 148.55
Total CHOL/HDL Ratio: 5
Triglycerides: 168 mg/dL — ABNORMAL HIGH (ref 0.0–149.0)
VLDL: 33.6 mg/dL (ref 0.0–40.0)

## 2018-10-19 LAB — CBC WITH DIFFERENTIAL/PLATELET
Basophils Absolute: 0.1 10*3/uL (ref 0.0–0.1)
Basophils Relative: 1.4 % (ref 0.0–3.0)
Eosinophils Absolute: 0.3 10*3/uL (ref 0.0–0.7)
Eosinophils Relative: 5.9 % — ABNORMAL HIGH (ref 0.0–5.0)
HCT: 45.1 % (ref 39.0–52.0)
Hemoglobin: 15.8 g/dL (ref 13.0–17.0)
Lymphocytes Relative: 23.9 % (ref 12.0–46.0)
Lymphs Abs: 1.4 10*3/uL (ref 0.7–4.0)
MCHC: 35 g/dL (ref 30.0–36.0)
MCV: 86.7 fl (ref 78.0–100.0)
Monocytes Absolute: 0.6 10*3/uL (ref 0.1–1.0)
Monocytes Relative: 10.9 % (ref 3.0–12.0)
Neutro Abs: 3.3 10*3/uL (ref 1.4–7.7)
Neutrophils Relative %: 57.9 % (ref 43.0–77.0)
Platelets: 182 10*3/uL (ref 150.0–400.0)
RBC: 5.2 Mil/uL (ref 4.22–5.81)
RDW: 13.5 % (ref 11.5–15.5)
WBC: 5.8 10*3/uL (ref 4.0–10.5)

## 2018-10-19 LAB — COMPREHENSIVE METABOLIC PANEL
ALT: 29 U/L (ref 0–53)
AST: 24 U/L (ref 0–37)
Albumin: 4.7 g/dL (ref 3.5–5.2)
Alkaline Phosphatase: 66 U/L (ref 39–117)
BUN: 13 mg/dL (ref 6–23)
CO2: 26 mEq/L (ref 19–32)
Calcium: 9.5 mg/dL (ref 8.4–10.5)
Chloride: 102 mEq/L (ref 96–112)
Creatinine, Ser: 1.2 mg/dL (ref 0.40–1.50)
GFR: 63.79 mL/min (ref >60.00)
Glucose, Bld: 109 mg/dL — ABNORMAL HIGH (ref 70–99)
Potassium: 4.1 mEq/L (ref 3.5–5.1)
Sodium: 138 mEq/L (ref 135–145)
Total Bilirubin: 1 mg/dL (ref 0.2–1.2)
Total Protein: 6.8 g/dL (ref 6.0–8.3)

## 2018-10-19 LAB — PSA: PSA: 1.2 ng/mL (ref 0.10–4.00)

## 2018-10-19 MED ORDER — METHOCARBAMOL 500 MG PO TABS: 500.0000 mg | ORAL_TABLET | Freq: Four times a day (QID) | ORAL | 0 refills | Status: DC

## 2018-10-19 MED ORDER — MELOXICAM 15 MG PO TABS: 15.0000 mg | ORAL_TABLET | Freq: Every day | ORAL | 0 refills | Status: DC
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
## 2018-10-19 NOTE — Telephone Encounter (Signed)
Patient called and sent to ED.
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
## 2018-11-16 ENCOUNTER — Encounter: Payer: Self-pay | Admitting: Internal Medicine

## 2018-12-14 ENCOUNTER — Ambulatory Visit (INDEPENDENT_AMBULATORY_CARE_PROVIDER_SITE_OTHER): Payer: 59

## 2018-12-14 ENCOUNTER — Other Ambulatory Visit: Payer: Self-pay

## 2018-12-14 DIAGNOSIS — Z23 Encounter for immunization: Secondary | ICD-10-CM

## 2019-02-07 ENCOUNTER — Other Ambulatory Visit: Payer: Self-pay | Admitting: Internal Medicine

## 2019-02-15 DIAGNOSIS — D225 Melanocytic nevi of trunk: Secondary | ICD-10-CM | POA: Diagnosis not present

## 2019-02-15 DIAGNOSIS — D2261 Melanocytic nevi of right upper limb, including shoulder: Secondary | ICD-10-CM | POA: Diagnosis not present

## 2019-02-15 DIAGNOSIS — D2262 Melanocytic nevi of left upper limb, including shoulder: Secondary | ICD-10-CM | POA: Diagnosis not present

## 2019-02-15 DIAGNOSIS — D2271 Melanocytic nevi of right lower limb, including hip: Secondary | ICD-10-CM | POA: Diagnosis not present

## 2019-02-15 DIAGNOSIS — B078 Other viral warts: Secondary | ICD-10-CM | POA: Diagnosis not present

## 2019-02-15 DIAGNOSIS — R208 Other disturbances of skin sensation: Secondary | ICD-10-CM | POA: Diagnosis not present

## 2019-03-29 DIAGNOSIS — S91311A Laceration without foreign body, right foot, initial encounter: Secondary | ICD-10-CM | POA: Diagnosis not present

## 2019-05-08 ENCOUNTER — Other Ambulatory Visit: Payer: Self-pay | Admitting: Internal Medicine

## 2019-05-15 ENCOUNTER — Ambulatory Visit: Payer: BC Managed Care – PPO | Attending: Internal Medicine

## 2019-05-15 DIAGNOSIS — Z20822 Contact with and (suspected) exposure to covid-19: Secondary | ICD-10-CM | POA: Diagnosis not present

## 2019-05-16 LAB — NOVEL CORONAVIRUS, NAA: SARS-CoV-2, NAA: NOT DETECTED

## 2019-08-08 ENCOUNTER — Other Ambulatory Visit: Payer: Self-pay | Admitting: Internal Medicine

## 2019-11-27 IMAGING — US US SCROTUM W/ DOPPLER COMPLETE
2 series · 13 of 25 positions shown · non-contrast
Comparison: None.

CLINICAL DATA: Initial evaluation for right scrotal lesion. Suspect
epididymal cyst versus varicocele.

EXAM:
SCROTAL ULTRASOUND
DOPPLER ULTRASOUND OF THE TESTICLES
TECHNIQUE: Complete ultrasound examination of the testicles, epididymis, and
other scrotal structures was performed. Color and spectral Doppler
ultrasound were also utilized to evaluate blood flow to the
testicles.

[Series 1: us scrotum w/ doppler complete · 0.08mm/px · 11 of 79 slices shown (1 of 2)]
[im 1/79]
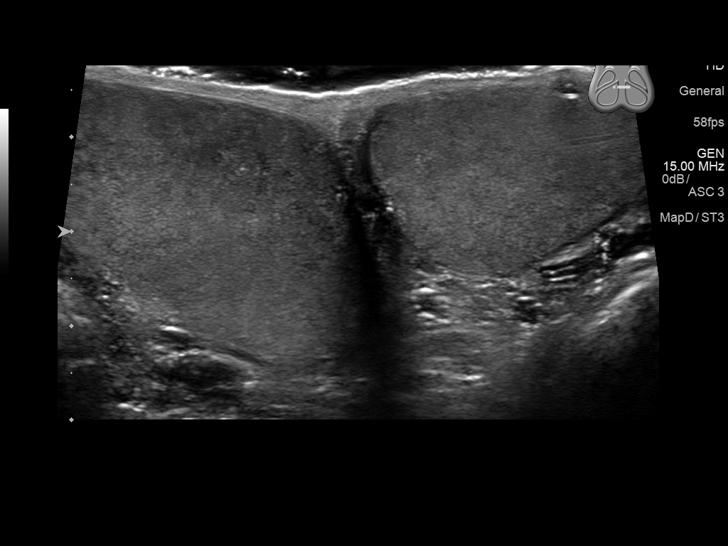
[im 8/79]
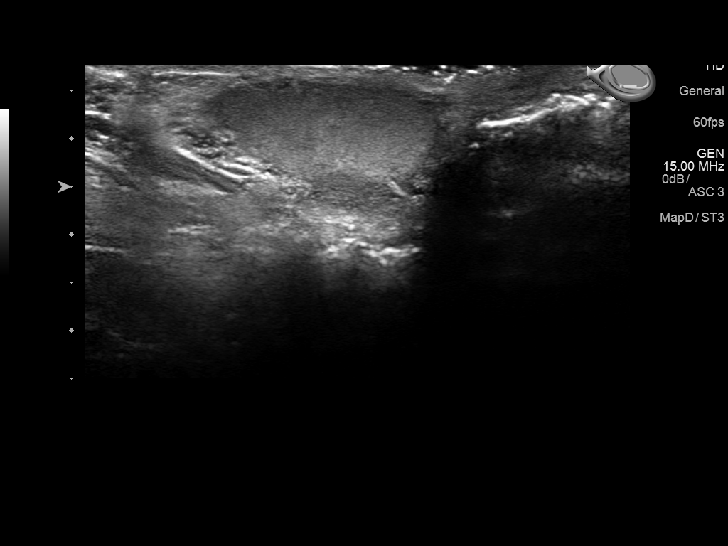
[im 15/79]
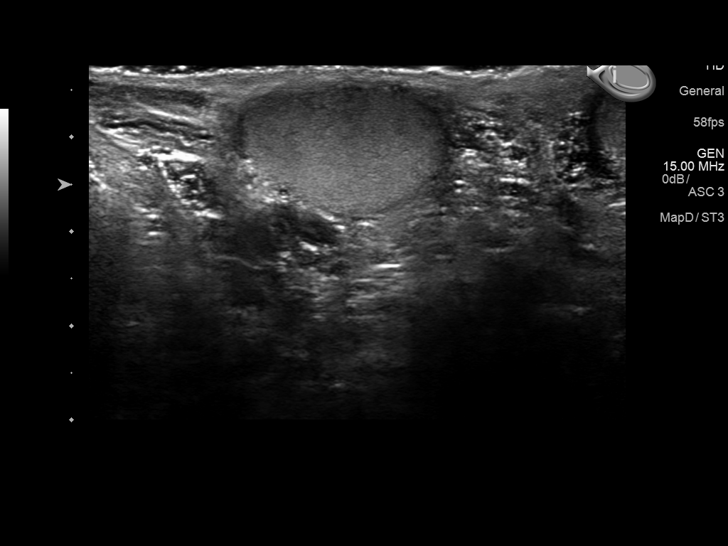
[im 23/79]
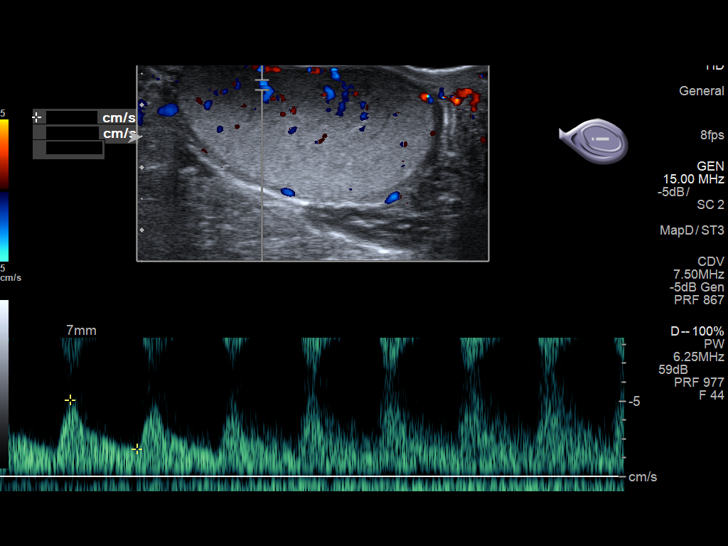
[im 30/79]
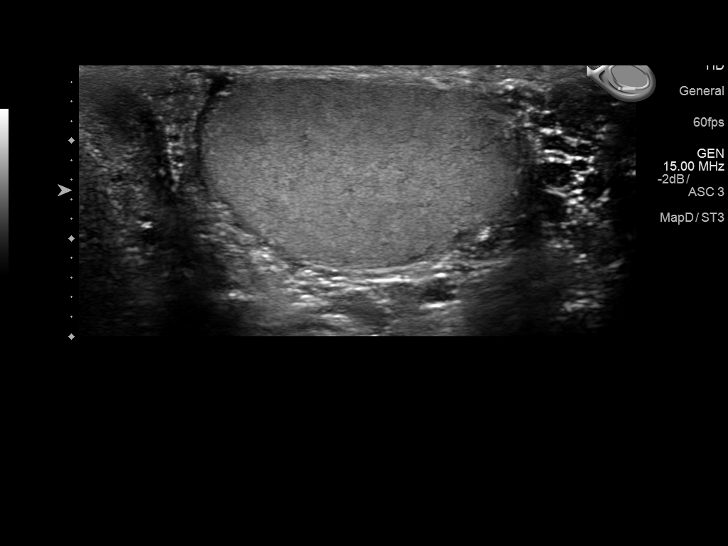
[im 38/79]
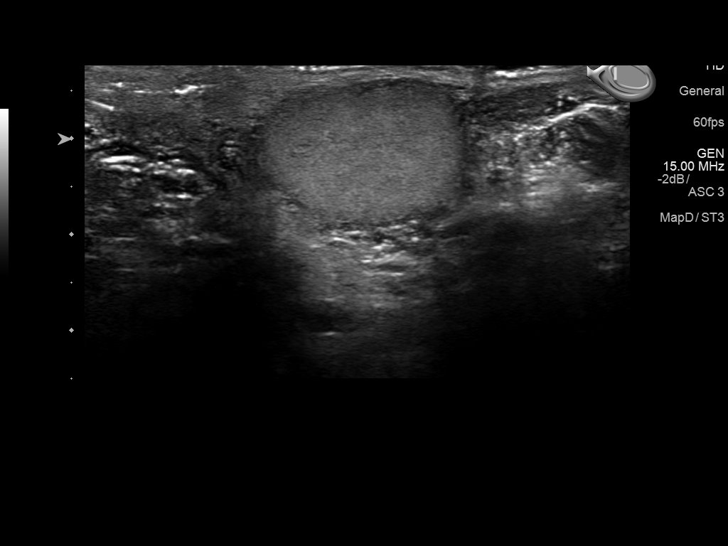
[im 45/79]
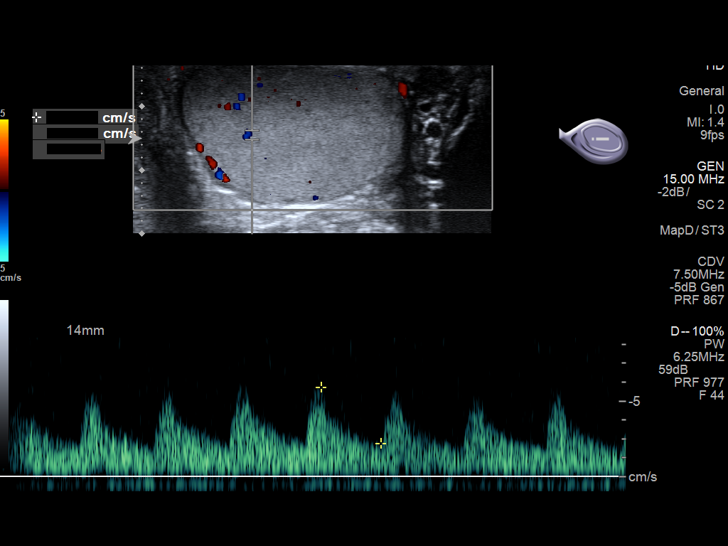
[im 53/79]
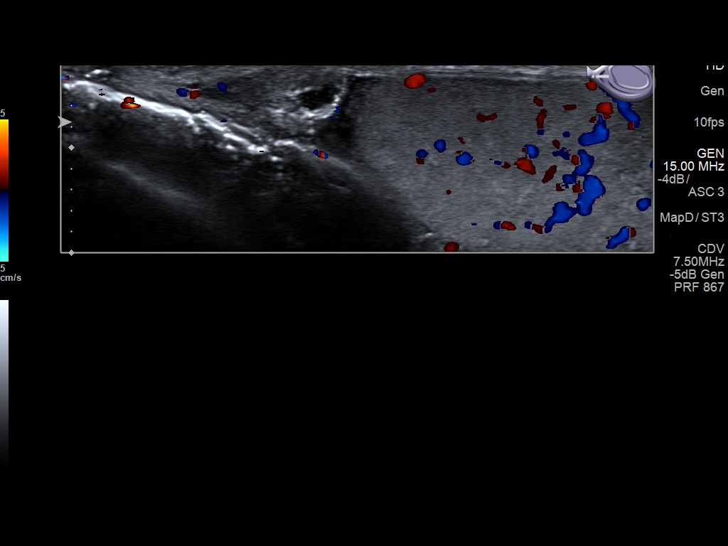
[im 60/79]
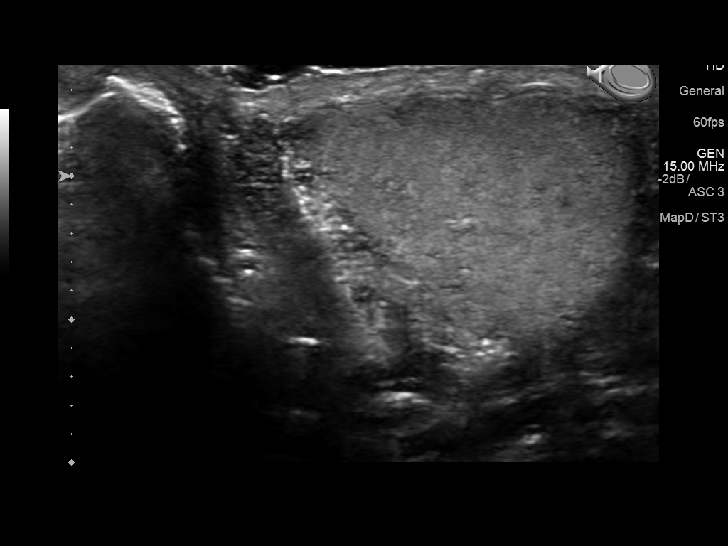
[im 67/79]
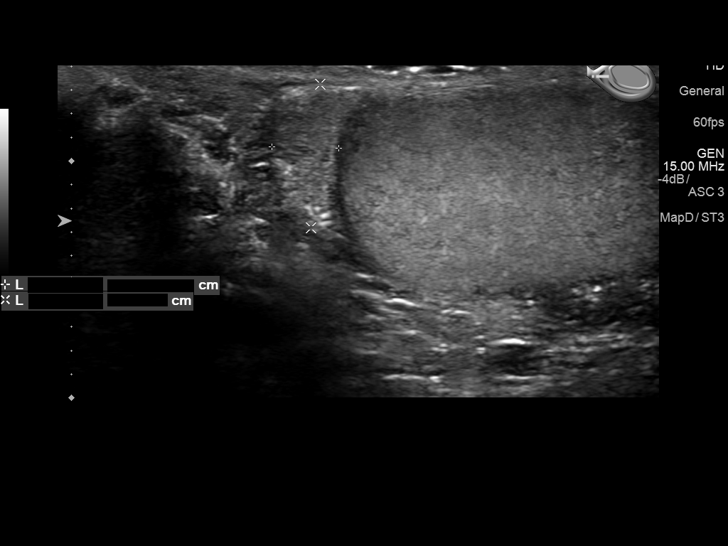
[im 75/79]
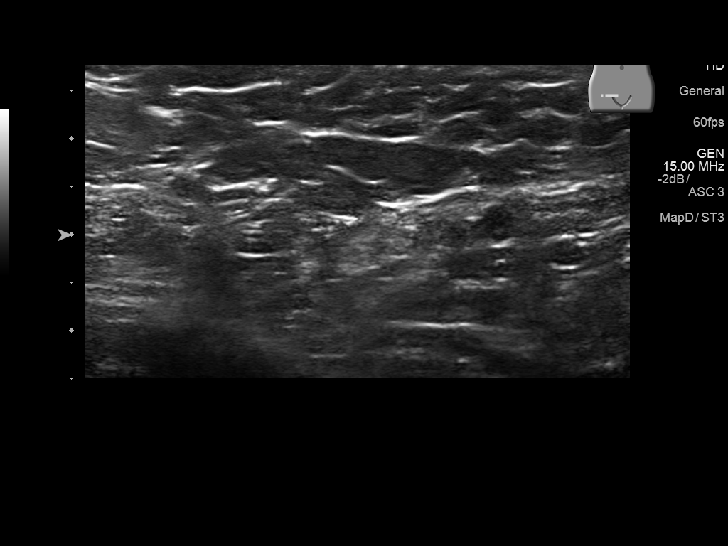

[Series 2001: us scrotum w/ doppler complete · 0.06mm/px · 2 of 12 slices shown (2 of 2)]
[im 1/12]
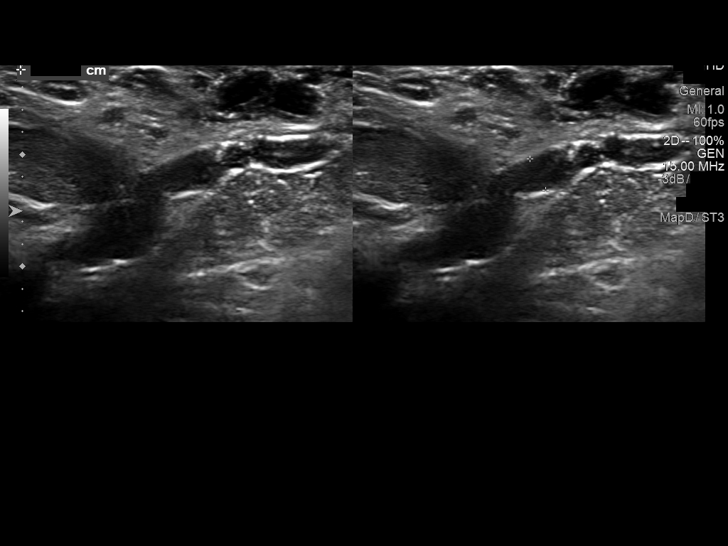
[im 12/12]
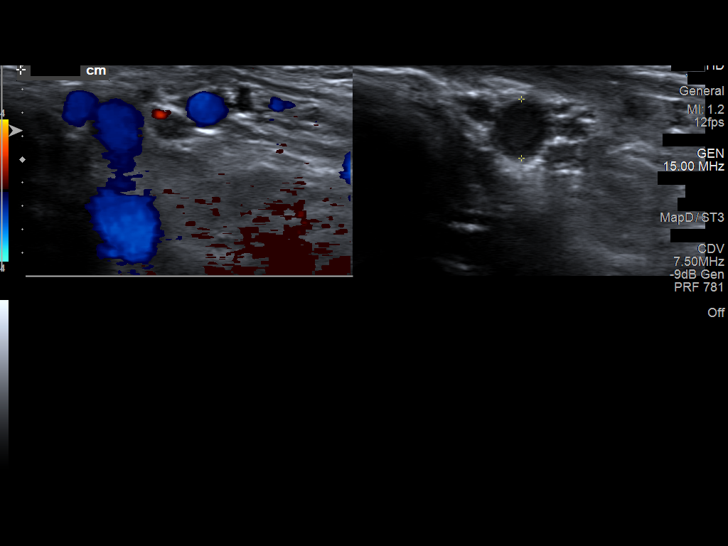

[13 of 25 positions shown; findings below may reference images not displayed]

FINDINGS: Right testicle

Measurements: 4.7 x 2.4 x 3.2 cm. No mass or microlithiasis
visualized.

Left testicle

Measurements: 3.6 x 2.0 x 3.3 cm. No mass or microlithiasis
visualized.

Right epididymis:  Normal in size and appearance.

Left epididymis:  Normal in size and appearance.

Hydrocele:  None visualized.

Varicocele: Bilateral varicoceles are present, right slightly larger
than left.

Pulsed Doppler interrogation of both testes demonstrates normal low
resistance arterial and venous waveforms bilaterally.
IMPRESSION: 1. Bilateral varicoceles, right slightly larger than left. Finding
most likely accounts for the palpable abnormality of concern.
2. Otherwise normal scrotal ultrasound.

## 2020-02-08 ENCOUNTER — Other Ambulatory Visit: Payer: Self-pay | Admitting: Internal Medicine

## 2020-06-04 ENCOUNTER — Other Ambulatory Visit: Payer: Self-pay | Admitting: Internal Medicine

## 2020-10-14 ENCOUNTER — Telehealth: Payer: Self-pay | Admitting: Internal Medicine

## 2020-10-14 DIAGNOSIS — Z0000 Encounter for general adult medical examination without abnormal findings: Secondary | ICD-10-CM

## 2020-10-14 NOTE — Telephone Encounter (Signed)
I have ordered TSH, A1c, CBC, CMP, Microalbumin, Lipid panel, and PSA is there anything else that needs to be ordered before physical on 11/19/2020.

## 2020-10-14 NOTE — Telephone Encounter (Signed)
Patient made an appt for a physical on 11/19/2020. He would like to have labs done before appointment. No orders in chart. Let me know if orders are placed and I will call patient to schedule. Thanks.

## 2020-10-14 NOTE — Telephone Encounter (Signed)
Called patient to schedule 2 appointments, first for depression/axiety/GI colonoscopy, 2nd appt for a physical. Lm to call office.

## 2020-10-14 NOTE — Addendum Note (Signed)
Addended by: Adair Laundry on: 10/14/2020 08:35 AM   Modules accepted: Orders

## 2020-11-05 DIAGNOSIS — D2261 Melanocytic nevi of right upper limb, including shoulder: Secondary | ICD-10-CM | POA: Diagnosis not present

## 2020-11-05 DIAGNOSIS — D225 Melanocytic nevi of trunk: Secondary | ICD-10-CM | POA: Diagnosis not present

## 2020-11-05 DIAGNOSIS — D2272 Melanocytic nevi of left lower limb, including hip: Secondary | ICD-10-CM | POA: Diagnosis not present

## 2020-11-05 DIAGNOSIS — D2262 Melanocytic nevi of left upper limb, including shoulder: Secondary | ICD-10-CM | POA: Diagnosis not present

## 2020-11-19 ENCOUNTER — Encounter: Payer: BC Managed Care – PPO | Admitting: Internal Medicine

## 2020-11-20 ENCOUNTER — Other Ambulatory Visit: Payer: Self-pay

## 2020-11-20 ENCOUNTER — Other Ambulatory Visit (INDEPENDENT_AMBULATORY_CARE_PROVIDER_SITE_OTHER): Payer: BC Managed Care – PPO

## 2020-11-20 DIAGNOSIS — Z0000 Encounter for general adult medical examination without abnormal findings: Secondary | ICD-10-CM | POA: Diagnosis not present

## 2020-11-20 DIAGNOSIS — Z125 Encounter for screening for malignant neoplasm of prostate: Secondary | ICD-10-CM | POA: Diagnosis not present

## 2020-11-20 LAB — COMPREHENSIVE METABOLIC PANEL
ALT: 18 U/L (ref 0–53)
AST: 19 U/L (ref 0–37)
Albumin: 4.6 g/dL (ref 3.5–5.2)
Alkaline Phosphatase: 64 U/L (ref 39–117)
BUN: 16 mg/dL (ref 6–23)
CO2: 29 mEq/L (ref 19–32)
Calcium: 9.6 mg/dL (ref 8.4–10.5)
Chloride: 102 mEq/L (ref 96–112)
Creatinine, Ser: 1.11 mg/dL (ref 0.40–1.50)
GFR: 75.96 mL/min (ref >60.00)
Glucose, Bld: 86 mg/dL (ref 70–99)
Potassium: 4 mEq/L (ref 3.5–5.1)
Sodium: 140 mEq/L (ref 135–145)
Total Bilirubin: 0.9 mg/dL (ref 0.2–1.2)
Total Protein: 6.5 g/dL (ref 6.0–8.3)

## 2020-11-20 LAB — LIPID PANEL
Cholesterol: 177 mg/dL (ref 0–200)
HDL: 47.8 mg/dL (ref >39.00)
LDL Cholesterol: 104 mg/dL — ABNORMAL HIGH (ref 0–99)
NonHDL: 129.07
Total CHOL/HDL Ratio: 4
Triglycerides: 127 mg/dL (ref 0.0–149.0)
VLDL: 25.4 mg/dL (ref 0.0–40.0)

## 2020-11-20 LAB — CBC WITH DIFFERENTIAL/PLATELET
Basophils Absolute: 0.1 10*3/uL (ref 0.0–0.1)
Basophils Relative: 1 % (ref 0.0–3.0)
Eosinophils Absolute: 0.1 10*3/uL (ref 0.0–0.7)
Eosinophils Relative: 1.3 % (ref 0.0–5.0)
HCT: 45.4 % (ref 39.0–52.0)
Hemoglobin: 15.4 g/dL (ref 13.0–17.0)
Lymphocytes Relative: 17.9 % (ref 12.0–46.0)
Lymphs Abs: 1.3 10*3/uL (ref 0.7–4.0)
MCHC: 33.9 g/dL (ref 30.0–36.0)
MCV: 87.7 fl (ref 78.0–100.0)
Monocytes Absolute: 0.8 10*3/uL (ref 0.1–1.0)
Monocytes Relative: 11.3 % (ref 3.0–12.0)
Neutro Abs: 4.9 10*3/uL (ref 1.4–7.7)
Neutrophils Relative %: 68.5 % (ref 43.0–77.0)
Platelets: 202 10*3/uL (ref 150.0–400.0)
RBC: 5.18 Mil/uL (ref 4.22–5.81)
RDW: 13.6 % (ref 11.5–15.5)
WBC: 7.2 10*3/uL (ref 4.0–10.5)

## 2020-11-20 LAB — MICROALBUMIN / CREATININE URINE RATIO
Creatinine,U: 163.6 mg/dL
Microalb Creat Ratio: 0.4 mg/g (ref 0.0–30.0)
Microalb, Ur: <0.7 mg/dL (ref 0.0–1.9)

## 2020-11-20 LAB — TSH: TSH: 2.71 u[IU]/mL (ref 0.35–5.50)

## 2020-11-20 LAB — HEMOGLOBIN A1C: Hgb A1c MFr Bld: 5.1 % (ref 4.6–6.5)

## 2020-11-20 LAB — PSA: PSA: 1.55 ng/mL (ref 0.10–4.00)

## 2020-11-26 ENCOUNTER — Ambulatory Visit (INDEPENDENT_AMBULATORY_CARE_PROVIDER_SITE_OTHER): Payer: BC Managed Care – PPO | Admitting: Internal Medicine

## 2020-11-26 ENCOUNTER — Other Ambulatory Visit: Payer: Self-pay

## 2020-11-26 ENCOUNTER — Telehealth: Payer: Self-pay | Admitting: Internal Medicine

## 2020-11-26 ENCOUNTER — Encounter: Payer: Self-pay | Admitting: Internal Medicine

## 2020-11-26 VITALS — BP 134/84 | HR 69 | Temp 96.6°F | Resp 15 | Ht 72.0 in | Wt 205.8 lb

## 2020-11-26 DIAGNOSIS — I10 Essential (primary) hypertension: Secondary | ICD-10-CM

## 2020-11-26 DIAGNOSIS — Z8371 Family history of colonic polyps: Secondary | ICD-10-CM | POA: Diagnosis not present

## 2020-11-26 DIAGNOSIS — N529 Male erectile dysfunction, unspecified: Secondary | ICD-10-CM | POA: Diagnosis not present

## 2020-11-26 DIAGNOSIS — Z0000 Encounter for general adult medical examination without abnormal findings: Secondary | ICD-10-CM

## 2020-11-26 DIAGNOSIS — F418 Other specified anxiety disorders: Secondary | ICD-10-CM

## 2020-11-26 MED ORDER — SILDENAFIL CITRATE 20 MG PO TABS: 20.0000 mg | ORAL_TABLET | Freq: Three times a day (TID) | ORAL | 0 refills | Status: DC

## 2020-11-26 NOTE — Patient Instructions (Addendum)
  Your PSA (your annual screening test for prostate Cancer),  Is very low,  Which is normal.  We will repeat this annually until you are 77.     Your CBC, thyroid ,cholesterol,  liver and kidney function are excellent .   You are   NOT  anemic or prediabetic    Please Plan to repeat fasting labs in 1 year.   YOUR WEIGHT IS FINE.  THE BMI IS INACCURATE IN ASSESSING HEALTH OR OPTIMAL WEIGHT IN PEOPLE WHO BUILD MUSCLE.   CONTINUE EXERCISING USING A COMBINATION OF:  Yoga,  cardio and low weights/high reps  REFERRAL TO DR [facility] IN PROGRESS
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
## 2020-11-26 NOTE — Telephone Encounter (Signed)
Lft pt vm to call ofc regarding referral to [facility]. Thank you!

## 2020-11-29 NOTE — Assessment & Plan Note (Addendum)
He has discontinued metoprolol.  Advised to check BP once weekly at home and come to the office for a  RN Visit to check accuracy of home machine. as his reading today is still elevated.  Normal renal function noted   Lab Results  Component Value Date   CREATININE 1.11 11/20/2020   Lab Results  Component Value Date   NA 140 11/20/2020   K 4.0 11/20/2020   CL 102 11/20/2020   CO2 29 11/20/2020

## 2020-11-29 NOTE — Assessment & Plan Note (Signed)
prescribing sildenafil  20 mg tablets.  1-2 daily prn ED

## 2020-11-29 NOTE — Assessment & Plan Note (Signed)
Anxiety and depressive symptoms have resolved and he no longer takes therapy

## 2020-11-29 NOTE — Assessment & Plan Note (Signed)

## 2021-01-28 ENCOUNTER — Telehealth: Payer: Self-pay | Admitting: Gastroenterology

## 2021-01-28 NOTE — Telephone Encounter (Signed)
Good afternoon Dr. Fuller Plan, patient just called and is requesting to see Dr. Hilarie Fredrickson since his father is a patient of his.  Dr. Hilarie Fredrickson can you please review and advise on scheduling?  Thank you.

## 2021-01-28 NOTE — Telephone Encounter (Signed)
Hi Dr. Fuller Plan,  D.O.D    We received a referral for patient to have a repeat colonoscopy. Patient had one done in 2014 which was normal at [facility] recommended to repeat every 5 years. Records are available via Epic and paper I will be sending them to you to review.   Please advise on scheduling.  Thanks

## 2021-02-06 NOTE — Telephone Encounter (Signed)
Okay for colonoscopy in patient with family history of advanced adenoma in a first-degree relative before age 53.

## 2021-02-09 NOTE — Telephone Encounter (Signed)
Called and left voicemail for patient to return call to schedule procedure.

## 2021-02-11 ENCOUNTER — Encounter: Payer: Self-pay | Admitting: Internal Medicine

## 2021-02-23 IMAGING — DX CHEST - 2 VIEW
2 series · 2 of 2 positions shown · non-contrast
Comparison: 05/18/2016

CLINICAL DATA: Dry cough.  [id number] infection and [REDACTED]

EXAM:
CHEST - 2 VIEW

[chest pa]
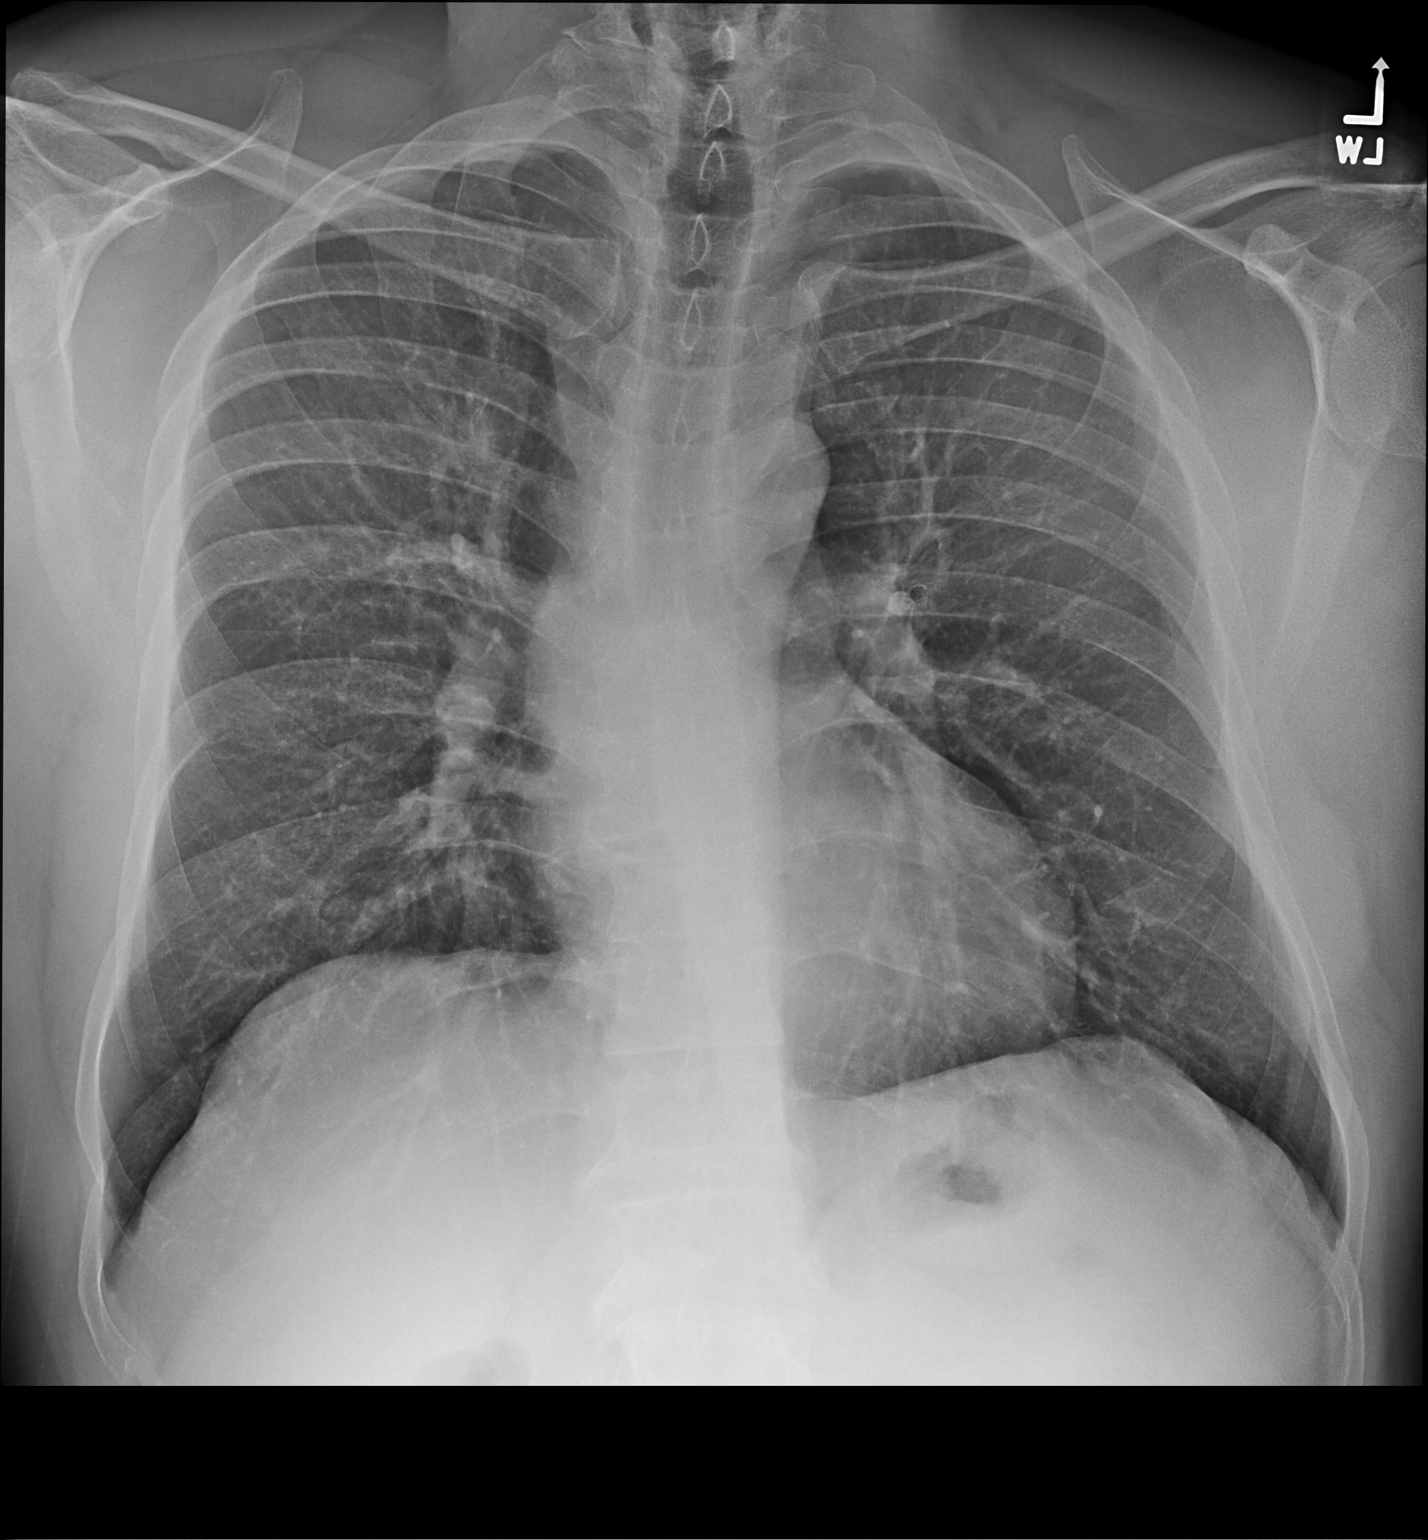

[chest lat]
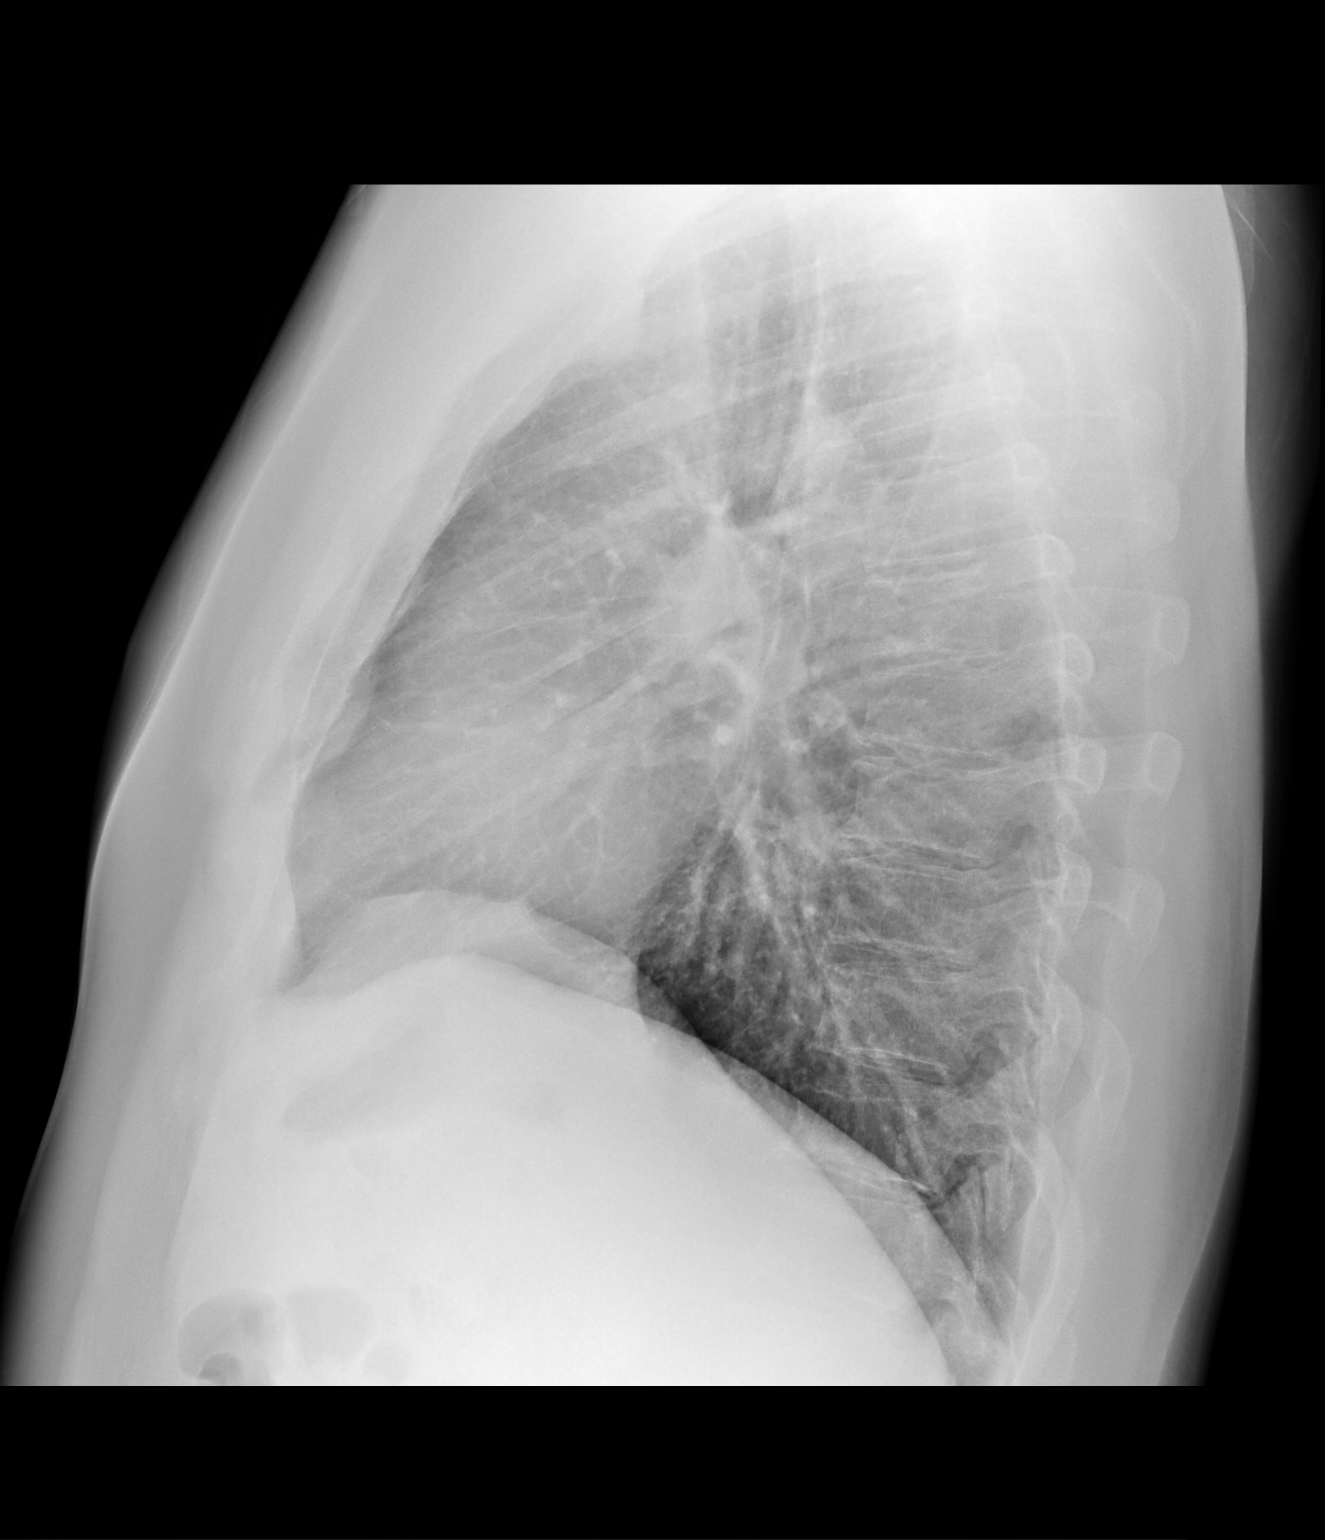

[2 of 2 positions shown; findings below may reference images not displayed]

FINDINGS: The heart size and mediastinal contours are within normal limits.
Both lungs are clear. The visualized skeletal structures are
unremarkable.
IMPRESSION: No active cardiopulmonary disease.

## 2021-03-16 ENCOUNTER — Ambulatory Visit: Payer: BC Managed Care – PPO

## 2021-03-16 ENCOUNTER — Encounter: Payer: Self-pay | Admitting: Internal Medicine

## 2021-03-16 ENCOUNTER — Telehealth: Payer: Self-pay

## 2021-03-16 NOTE — Telephone Encounter (Signed)
Multiple attempts made to reach patient at home number- unable to leave a message as phone line is "busy"; will attempt to reach patient at end of business day to reschedule PV appt- if PV RN unable to reach patient a no show letter will be sent to the patient and the PV and procedure appts will be cancelled;

## 2021-03-17 ENCOUNTER — Other Ambulatory Visit: Payer: Self-pay

## 2021-03-17 ENCOUNTER — Ambulatory Visit (AMBULATORY_SURGERY_CENTER): Payer: BC Managed Care – PPO

## 2021-03-17 ENCOUNTER — Encounter: Payer: Self-pay | Admitting: Internal Medicine

## 2021-03-17 VITALS — Ht 72.0 in | Wt 200.0 lb

## 2021-03-17 DIAGNOSIS — Z1211 Encounter for screening for malignant neoplasm of colon: Secondary | ICD-10-CM

## 2021-03-17 MED ORDER — PLENVU 140 G PO SOLR: 1.0000 | ORAL | 0 refills | Status: DC

## 2021-03-17 NOTE — Progress Notes (Signed)
Pre visit completed via phone call; Patient verified name, DOB, and address; No egg or soy allergy known to patient  No issues known to pt with past sedation with any surgeries or procedures Patient denies ever being told they had issues or difficulty with intubation  No FH of Malignant Hyperthermia Pt is not on diet pills Pt is not on home 02  Pt is not on blood thinners  Pt denies issues with constipation --complete opposite issue- IBS-D= takes daily Imodium- advised to not take Imodium x 5 days prior to prep starting No A fib or A flutter Pt is fully vaccinated for Covid x 2 + booster; Coupon given to pt in PV today, Code to Pharmacy and NO PA's for preps discussed with pt in PV today  Discussed with pt there will be an out-of-pocket cost for prep and that varies from $0 to 70 +  dollars - pt verbalized understanding  Due to the COVID-19 pandemic we are asking patients to follow certain guidelines in PV and the [place]   Pt aware of COVID protocols and [facility] guidelines

## 2021-03-30 ENCOUNTER — Ambulatory Visit (AMBULATORY_SURGERY_CENTER): Payer: BC Managed Care – PPO | Admitting: Internal Medicine

## 2021-03-30 ENCOUNTER — Other Ambulatory Visit: Payer: Self-pay

## 2021-03-30 ENCOUNTER — Encounter: Payer: Self-pay | Admitting: Internal Medicine

## 2021-03-30 VITALS — BP 123/75 | HR 54 | Temp 98.6°F | Resp 12 | Ht 72.0 in | Wt 200.0 lb

## 2021-03-30 DIAGNOSIS — Z1211 Encounter for screening for malignant neoplasm of colon: Secondary | ICD-10-CM | POA: Diagnosis not present

## 2021-03-30 DIAGNOSIS — D122 Benign neoplasm of ascending colon: Secondary | ICD-10-CM

## 2021-03-30 DIAGNOSIS — D120 Benign neoplasm of cecum: Secondary | ICD-10-CM

## 2021-03-30 DIAGNOSIS — D123 Benign neoplasm of transverse colon: Secondary | ICD-10-CM

## 2021-03-30 MED ORDER — SODIUM CHLORIDE 0.9 % IV SOLN: 500.0000 mL | INTRAVENOUS | Status: DC

## 2021-03-30 NOTE — Progress Notes (Signed)
Pt's states no medical or surgical changes since previsit or office visit. 

## 2021-03-30 NOTE — Progress Notes (Signed)
GASTROENTEROLOGY PROCEDURE H&P NOTE   Primary Care Physician: Crecencio Mc, MD    Reason for Procedure:  Family history of advanced adenoma in first-degree relative before age 52  Plan:    Screening colonoscopy  Patient is appropriate for endoscopic procedure(s) in the ambulatory ([facility]) setting.  The nature of the procedure, as well as the risks, benefits, and alternatives were carefully and thoroughly reviewed with the patient. Ample time for discussion and questions allowed. The patient understood, was satisfied, and agreed to proceed.     HPI: Travis Bennett is a 55 y.o. male who presents for screening colonoscopy.  Medical history as below.  Last exam 2014.  Tolerated the prep.  No recent chest pain or shortness of breath.  No abdominal pain.  Past Medical History:  Diagnosis Date   Anxiety    not on meds at this time (03/17/2021)   Blood in stool    Chicken pox    Chronic headaches    Depression    not on meds at this time (03/17/2021)   Hypertension    not taking meds since 02/2020   Irritable bowel syndrome with diarrhea    Seasonal allergies     Past Surgical History:  Procedure Laterality Date   ANAL FISTULECTOMY  1995   COLONOSCOPY  2014   at [facility] prep-recall 5yr-normal   [facility]COPY  2009   [facility]   Prior to Admission medications   Medication Sig Start Date End Date Taking? Authorizing Provider  Loperamide HCl (IMODIUM PO) Take 1 tablet by mouth daily at 6 (six) AM.    [provider]  MELATONIN PO Take 1 tablet by mouth at bedtime as needed.    [provider]    Current Outpatient Medications  Medication Sig Dispense Refill   Loperamide HCl (IMODIUM PO) Take 1 tablet by mouth daily at 6 (six) AM.     MELATONIN PO Take 1 tablet by mouth at bedtime as needed.     Current Facility-Administered Medications  Medication Dose Route Frequency Provider Last Rate Last  Admin   0.9 %  sodium chloride infusion  500 mL Intravenous Continuous Kolter Reaver, JLajuan Lines MD        Allergies as of 03/30/2021   (No Known Allergies)    Family History  Problem Relation Age of Onset   Hyperlipidemia Mother    Colon polyps Father 564  Cancer Father        throat Ca,  tobacco    Early death Maternal Aunt    Cancer Maternal Grandmother        Ovarian   Alcohol abuse Maternal Grandmother    Bladder Cancer Maternal Grandmother    Heart disease Maternal Grandfather 716      AMI    Alzheimer's disease Paternal Grandmother    Cancer Paternal Grandfather        colon   Heart disease Paternal Grandfather    Celiac disease Daughter    Prostate cancer Neg Hx    Kidney cancer Neg Hx    Colon cancer Neg Hx    Esophageal cancer Neg Hx    Rectal cancer Neg Hx    Stomach cancer Neg Hx     Social History   Socioeconomic History   Marital status: Married    Spouse name: Not on file   Number of children: Not on file   Years of education: Not on file  Highest education level: Not on file  Occupational History   Not on file  Tobacco Use   Smoking status: Never   Smokeless tobacco: Never  Vaping Use   Vaping Use: Never used  Substance and Sexual Activity   Alcohol use: Yes    Alcohol/week: 7.0 standard drinks    Types: 7 Standard drinks or equivalent per week   Drug use: No   Sexual activity: Yes    Partners: Female  Other Topics Concern   Not on file  Social History Narrative   Married: 6 kids (blended family)   Luz Lex with work   Regular exercise: not recently   Caffeine use: coffee in the am   Social Determinants of Radio broadcast assistant Strain: Not on file  Food Insecurity: Not on file  Transportation Needs: Not on file  Physical Activity: Not on file  Stress: Not on file  Social Connections: Not on file  Intimate Partner Violence: Not on file    Physical Exam: Vital signs in last 24 hours: _0  128/80   Pulse 68   Temp 98.6 F (37 C)  (Temporal)   Ht 6' (1.829 m)   Wt 200 lb (90.7 kg)   SpO2 98%   BMI 27.12 kg/m  GEN: NAD EYE: Sclerae anicteric ENT: MMM CV: Non-tachycardic Pulm: CTA b/l GI: Soft, NT/ND NEURO:  Alert & Oriented x 3   Zenovia Jarred, MD [facility]  03/30/2021 11:38 AM

## 2021-03-30 NOTE — Progress Notes (Signed)
Report given to PACU, vss 

## 2021-03-30 NOTE — Patient Instructions (Signed)
Thank you for allowing [place] to care for you today! Await results, approximately 2 weeks.  Will make recommendation at that time for next colonoscopy. Resume previous diet and medications today. Return to your normal daily activities tomorrow, 03/31/21.   YOU HAD AN ENDOSCOPIC PROCEDURE TODAY AT [facility]:   Refer to the procedure report that was given to you for any specific questions about what was found during the examination.  If the procedure report does not answer your questions, please call your gastroenterologist to clarify.  If you requested that your care partner not be given the details of your procedure findings, then the procedure report has been included in a sealed envelope for you to review at your convenience later.  YOU SHOULD EXPECT: Some feelings of bloating in the abdomen. Passage of more gas than usual.  Walking can help get rid of the air that was put into your GI tract during the procedure and reduce the bloating. If you had a lower endoscopy (such as a colonoscopy or flexible sigmoidoscopy) you may notice spotting of blood in your stool or on the toilet paper. If you underwent a bowel prep for your procedure, you may not have a normal bowel movement for a few days.  Please Note:  You might notice some irritation and congestion in your nose or some drainage.  This is from the oxygen used during your procedure.  There is no need for concern and it should clear up in a day or so.  SYMPTOMS TO REPORT IMMEDIATELY:  Following lower endoscopy (colonoscopy or flexible sigmoidoscopy):  Excessive amounts of blood in the stool  Significant tenderness or worsening of abdominal pains  Swelling of the abdomen that is new, acute  Fever of 100F or higher    For urgent or emergent issues, a gastroenterologist can be reached at any hour by calling [phone]. Do not use MyChart messaging for urgent concerns.    DIET:  We do recommend a small meal at first, but  then you may proceed to your regular diet.  Drink plenty of fluids but you should avoid alcoholic beverages for 24 hours.  ACTIVITY:  You should plan to take it easy for the rest of today and you should NOT DRIVE or use heavy machinery until tomorrow (because of the sedation medicines used during the test).    FOLLOW UP: Our staff will call the number listed on your records 48-72 hours following your procedure to check on you and address any questions or concerns that you may have regarding the information given to you following your procedure. If we do not reach you, we will leave a message.  We will attempt to reach you two times.  During this call, we will ask if you have developed any symptoms of COVID 19. If you develop any symptoms (ie: fever, flu-like symptoms, shortness of breath, cough etc.) before then, please call [phone].  If you test positive for Covid 19 in the 2 weeks post procedure, please call and report this information to [place].    If any biopsies were taken you will be contacted by phone or by letter within the next 1-3 weeks.  Please call us at [phone] if you have not heard about the biopsies in 3 weeks.    SIGNATURES/CONFIDENTIALITY: You and/or your care partner have signed paperwork which will be entered into your electronic medical record.  These signatures attest to the fact that that the information above on your After Visit  Summary has been reviewed and is understood.  Full responsibility of the confidentiality of this discharge information lies with you and/or your care-partner.  

## 2021-03-30 NOTE — Progress Notes (Signed)
Called to room to assist during endoscopic procedure.  Patient ID and intended procedure confirmed with present staff. Received instructions for my participation in the procedure from the performing physician.  

## 2021-03-30 NOTE — Op Note (Signed)
[facility] Patient Name: Travis Bennett Procedure Date: 03/30/2021 11:39 AM MRN: [id number] Endoscopist: Jerene Bears , MD Age: 55 Referring MD:  Date of Birth: 12/21/1965 Gender: Male Account #: [id number] Procedure:                Colonoscopy Indications:              Colon cancer screening in patient with 1st-degree                            relative having multiple adenomas of the colon                            before age 68, Last colonoscopy: 2014 (previous 3                            screening colonoscopies without personal history of                            colonic polyps) Medicines:                Monitored Anesthesia Care Procedure:                Pre-Anesthesia Assessment:                           - Prior to the procedure, a History and Physical                            was performed, and patient medications and                            allergies were reviewed. The patient's tolerance of                            previous anesthesia was also reviewed. The risks                            and benefits of the procedure and the sedation                            options and risks were discussed with the patient.                            All questions were answered, and informed consent                            was obtained. Prior Anticoagulants: The patient has                            taken no previous anticoagulant or antiplatelet                            agents. ASA Grade Assessment: II - A patient with  mild systemic disease. After reviewing the risks                            and benefits, the patient was deemed in                            satisfactory condition to undergo the procedure.                           After obtaining informed consent, the colonoscope                            was passed under direct vision. Throughout the                            procedure, the patient's blood pressure, pulse,  and                            oxygen saturations were monitored continuously. The                            CF [id number] #[id number] was introduced through the anus                            and advanced to the cecum, identified by                            appendiceal orifice and ileocecal valve. The                            colonoscopy was performed without difficulty. The                            patient tolerated the procedure well. The quality                            of the bowel preparation was excellent. The                            ileocecal valve, appendiceal orifice, and rectum                            were photographed. Scope In: 11:47:43 AM Scope Out: 12:12:05 PM Scope Withdrawal Time: 0 hours 21 minutes 22 seconds  Total Procedure Duration: 0 hours 24 minutes 22 seconds  Findings:                 The digital rectal exam was normal.                           A 8 mm polyp was found in the cecum. The polyp was                            sessile. The polyp was removed with a cold snare.  Resection and retrieval were complete.                           A 3 mm polyp was found in the ascending colon. The                            polyp was sessile. The polyp was removed with a                            cold snare. Resection and retrieval were complete.                           A 5 mm polyp was found in the hepatic flexure. The                            polyp was sessile. The polyp was removed with a                            cold snare. Resection and retrieval were complete.                           A few small-mouthed diverticula were found in the                            sigmoid colon.                           Internal hemorrhoids were found during                            retroflexion. The hemorrhoids were small. Complications:            No immediate complications. Estimated Blood Loss:     Estimated blood loss was  minimal. Impression:               - One 8 mm polyp in the cecum, removed with a cold                            snare. Resected and retrieved.                           - One 3 mm polyp in the ascending colon, removed                            with a cold snare. Resected and retrieved.                           - One 5 mm polyp at the hepatic flexure, removed                            with a cold snare. Resected and retrieved.                           - Diverticulosis in  the sigmoid colon.                           - Small internal hemorrhoids. Recommendation:           - Patient has a contact number available for                            emergencies. The signs and symptoms of potential                            delayed complications were discussed with the                            patient. Return to normal activities tomorrow.                            Written discharge instructions were provided to the                            patient.                           - Resume previous diet.                           - Continue present medications.                           - Await pathology results.                           - Repeat colonoscopy is recommended. The                            colonoscopy date will be determined after pathology                            results from today's exam become available for                            review. Jerene Bears, MD 03/30/2021 12:16:16 PM This report has been signed electronically.

## 2021-04-01 ENCOUNTER — Telehealth: Payer: Self-pay

## 2021-04-01 NOTE — Telephone Encounter (Signed)
No answer, left message to call back later today, B.Galileo Colello RN. 

## 2021-04-06 ENCOUNTER — Encounter: Payer: Self-pay | Admitting: Internal Medicine

## 2021-07-01 ENCOUNTER — Ambulatory Visit: Payer: BC Managed Care – PPO | Admitting: Internal Medicine

## 2021-07-01 ENCOUNTER — Other Ambulatory Visit: Payer: Self-pay

## 2021-07-01 ENCOUNTER — Encounter: Payer: Self-pay | Admitting: Internal Medicine

## 2021-07-01 VITALS — BP 136/82 | HR 73 | Temp 98.0°F | Ht 72.0 in | Wt 201.6 lb

## 2021-07-01 DIAGNOSIS — F418 Other specified anxiety disorders: Secondary | ICD-10-CM | POA: Diagnosis not present

## 2021-07-01 MED ORDER — BUPROPION HCL ER (XL) 150 MG PO TB24: 150.0000 mg | ORAL_TABLET | Freq: Every day | ORAL | 5 refills | Status: DC

## 2021-07-01 MED ORDER — ESCITALOPRAM OXALATE 10 MG PO TABS: 10.0000 mg | ORAL_TABLET | Freq: Every day | ORAL | 5 refills | Status: DC

## 2021-07-01 NOTE — Progress Notes (Signed)
? ?Subjective:  ?Patient ID: Travis Bennett, male    DOB: 1966/04/17  Age: 56 y.o. MRN: [id number] ? ?CC: The encounter diagnosis was Anxiety with depression. ? ? ?This visit occurred during the SARS-CoV-2 public health emergency.  Safety protocols were in place, including screening questions prior to the visit, additional usage of staff PPE, and extensive cleaning of exam room while observing appropriate contact time as indicated for disinfecting solutions.   ? ?HPI ?Travis Bennett presents for  ?Chief Complaint  ?Patient presents with  ? Depression  ?  Pt stated that he has started feeling down, no motivation, no energy. Used to take medication for depression but not currently.  ? ? ?1) Recurrent depression:  previous episode was triggered by custody fight with first wife and COVID.  Weaned off  of lexapro after lifestyle changes improved his management of anxiety and his outlooik.   Symptoms of depression have returned :job is horrible,  financial strain of kids in college,   feels angry and is verbally angry at times with wife,  sensitive to noises,  some marital conflict , wife thinks  Has since then begun to wonder if 1) is there a  medication solution  to depression  that doesn't cause side effects?  2) requests a "full mental evaluation"  with psychologist /psychiatrist.  ? ?Using 5 mg melatonin to manage insomnia ? ?2 mg Imodium for the IBS  ? ?Outpatient Medications Prior to Visit  ?Medication Sig Dispense Refill  ? Loperamide HCl (IMODIUM PO) Take 1 tablet by mouth daily at 6 (six) AM.    ? MELATONIN PO Take 1 tablet by mouth at bedtime as needed.    ? ?No facility-administered medications prior to visit.  ? ? ?Review of Systems; ? ?Patient denies headache, fevers, malaise, unintentional weight loss, skin rash, eye pain, sinus congestion and sinus pain, sore throat, dysphagia,  hemoptysis , cough, dyspnea, wheezing, chest pain, palpitations, orthopnea, edema, abdominal pain, nausea, melena, diarrhea,  constipation, flank pain, dysuria, hematuria, urinary  Frequency, nocturia, numbness, tingling, seizures,  Focal weakness, Loss of consciousness,  Tremor, insomnia, depression, anxiety, and suicidal ideation.   ? ? ? ?Objective:  ?BP 136/82 (BP Location: Left Arm, Patient Position: Sitting, Cuff Size: Large)   Pulse 73   Temp 98 ?F (36.7 ?C) (Oral)   Ht 6' (1.829 m)   Wt 201 lb 9.6 oz (91.4 kg)   SpO2 97%   BMI 27.34 kg/m?  ? ?BP Readings from Last 3 Encounters:  ?07/01/21 136/82  ?03/30/21 123/75  ?11/26/20 134/84  ? ? ?Wt Readings from Last 3 Encounters:  ?07/01/21 201 lb 9.6 oz (91.4 kg)  ?03/30/21 200 lb (90.7 kg)  ?03/17/21 200 lb (90.7 kg)  ? ? ?General appearance: alert, cooperative and appears stated age ?Ears: normal TM's and external ear canals both ears ?Throat: lips, mucosa, and tongue normal; teeth and gums normal ?Neck: no adenopathy, no carotid bruit, supple, symmetrical, trachea midline and thyroid not enlarged, symmetric, no tenderness/mass/nodules ?Back: symmetric, no curvature. ROM normal. No CVA tenderness. ?Lungs: clear to auscultation bilaterally ?Heart: regular rate and rhythm, S1, S2 normal, no murmur, click, rub or gallop ?Abdomen: soft, non-tender; bowel sounds normal; no masses,  no organomegaly ?Pulses: 2+ and symmetric ?Skin: Skin color, texture, turgor normal. No rashes or lesions ?Lymph nodes: Cervical, supraclavicular, and axillary nodes normal. ? ?Lab Results  ?Component Value Date  ? HGBA1C 5.1 11/20/2020  ? HGBA1C 5.1 03/14/2013  ? ? ?Lab Results  ?Component Value Date  ?  CREATININE 1.11 11/20/2020  ? CREATININE 1.20 10/19/2018  ? CREATININE 1.33 08/30/2016  ? ? ?Lab Results  ?Component Value Date  ? WBC 7.2 11/20/2020  ? HGB 15.4 11/20/2020  ? HCT 45.4 11/20/2020  ? PLT 202.0 11/20/2020  ? GLUCOSE 86 11/20/2020  ? CHOL 177 11/20/2020  ? TRIG 127.0 11/20/2020  ? HDL 47.80 11/20/2020  ? LDLCALC 104 (H) 11/20/2020  ? ALT 18 11/20/2020  ? AST 19 11/20/2020  ? NA 140 11/20/2020   ? K 4.0 11/20/2020  ? CL 102 11/20/2020  ? CREATININE 1.11 11/20/2020  ? BUN 16 11/20/2020  ? CO2 29 11/20/2020  ? TSH 2.71 11/20/2020  ? PSA 1.55 11/20/2020  ? HGBA1C 5.1 11/20/2020  ? MICROALBUR <0.7 11/20/2020  ? ? ?DG Chest 2 View ? ?Result Date: 10/19/2018 ?CLINICAL DATA:  Dry cough.  COVID-19 infection and March EXAM: CHEST - 2 VIEW COMPARISON:  05/18/2016 FINDINGS: The heart size and mediastinal contours are within normal limits. Both lungs are clear. The visualized skeletal structures are unremarkable. IMPRESSION: No active cardiopulmonary disease. Electronically Signed   By: Franchot Gallo M.D.   On: 10/19/2018 15:44  ? ? ?Assessment & Plan:  ? ?Problem List Items Addressed This Visit   ? ? Anxiety with depression - Primary  ?  RECURRENT.  Secondary to unresolved conflict with ex wife ,  Differences with current wife regarding the management of expectations of children,,   Financial strain of putting kids through college. Symptoms include lack of motivation,  Fatigue,  Irritability ,  Anxiety,  Feeling like a failure,  And trouble managing anger calmly.  Referring to Dr Nicolasa Ducking.  Starting wellbutrin and lexapro  ?  ?  ? Relevant Medications  ? escitalopram (LEXAPRO) 10 MG tablet  ? buPROPion (WELLBUTRIN XL) 150 MG 24 hr tablet  ? Other Relevant Orders  ? Ambulatory referral to Psychiatry  ? ? ?I spent 30 minutes dedicated to the care of this patient on the date of this encounter to include pre-visit review of patient's medical history,  most recent imaging studies, Face-to-face time with the patient , and post visit ordering of testing and therapeutics.   ? ?Follow-up: Return in about 3 months (around 10/01/2021). ? ? ?Crecencio Mc, MD ?

## 2021-07-01 NOTE — Patient Instructions (Signed)
?  I am referring you to Dr Helane Gunther , MD  (psychiatry) ? ? ?While we are waiting: ? ?Wellbutrin 150 mg one tablet daily at/with breakfast ? ?Lexapro 10 mg one tablet daily with breakfast (or another meal , whichever you prefer) ? ? ?GET SOME EXERCISE REGULARLY ? ?CONSIDER USING SLEEP MASK ,  WEIGHTED BLANKET,  EAR BUDS OR NOISE CANCELLING BUDS TO DROWN OUT Travis Bennett'S WORK  ? ?

## 2021-07-01 NOTE — Assessment & Plan Note (Signed)
RECURRENT.  Secondary to unresolved conflict with ex wife ,  Differences with current wife regarding the management of expectations of children,,   Financial strain of putting kids through college. Symptoms include lack of motivation,  Fatigue,  Irritability ,  Anxiety,  Feeling like a failure,  And trouble managing anger calmly.  Referring to Dr Nicolasa Ducking.  Starting wellbutrin and lexapro  ?

## 2021-07-18 ENCOUNTER — Encounter: Payer: Self-pay | Admitting: Internal Medicine

## 2021-07-18 DIAGNOSIS — N529 Male erectile dysfunction, unspecified: Secondary | ICD-10-CM

## 2021-07-28 MED ORDER — BUSPIRONE HCL 15 MG PO TABS: 15.0000 mg | ORAL_TABLET | Freq: Two times a day (BID) | ORAL | 2 refills | Status: DC

## 2021-07-28 NOTE — Assessment & Plan Note (Signed)
Aggravated by lexapro.  1 month trial of buspirone as a substitution. Continue wellbutrin  ?

## 2021-08-13 ENCOUNTER — Encounter: Payer: Self-pay | Admitting: Internal Medicine

## 2021-09-23 ENCOUNTER — Encounter: Payer: Self-pay | Admitting: Internal Medicine

## 2021-09-29 ENCOUNTER — Other Ambulatory Visit: Payer: Self-pay | Admitting: Internal Medicine

## 2021-10-02 ENCOUNTER — Ambulatory Visit: Payer: BC Managed Care – PPO | Admitting: Internal Medicine

## 2021-10-06 ENCOUNTER — Ambulatory Visit: Payer: BC Managed Care – PPO | Admitting: Internal Medicine

## 2021-11-20 ENCOUNTER — Telehealth: Payer: Self-pay | Admitting: Internal Medicine

## 2021-11-20 ENCOUNTER — Other Ambulatory Visit: Payer: Self-pay

## 2021-11-20 DIAGNOSIS — Z125 Encounter for screening for malignant neoplasm of prostate: Secondary | ICD-10-CM

## 2021-11-20 DIAGNOSIS — Z0000 Encounter for general adult medical examination without abnormal findings: Secondary | ICD-10-CM

## 2021-11-20 DIAGNOSIS — R7301 Impaired fasting glucose: Secondary | ICD-10-CM

## 2021-11-20 DIAGNOSIS — I10 Essential (primary) hypertension: Secondary | ICD-10-CM

## 2021-11-20 DIAGNOSIS — E785 Hyperlipidemia, unspecified: Secondary | ICD-10-CM

## 2021-11-20 DIAGNOSIS — R5383 Other fatigue: Secondary | ICD-10-CM

## 2021-11-20 NOTE — Telephone Encounter (Signed)
Patient has a lab appt 11/26/2021, there are no orders in.

## 2021-11-20 NOTE — Progress Notes (Signed)
Labs placed for pt's lab appt on 11/26/21

## 2021-11-25 ENCOUNTER — Other Ambulatory Visit (INDEPENDENT_AMBULATORY_CARE_PROVIDER_SITE_OTHER): Payer: BC Managed Care – PPO

## 2021-11-25 DIAGNOSIS — Z0000 Encounter for general adult medical examination without abnormal findings: Secondary | ICD-10-CM

## 2021-11-25 DIAGNOSIS — Z125 Encounter for screening for malignant neoplasm of prostate: Secondary | ICD-10-CM

## 2021-11-25 LAB — CBC WITH DIFFERENTIAL/PLATELET
Basophils Absolute: 0.1 10*3/uL (ref 0.0–0.1)
Basophils Relative: 1.3 % (ref 0.0–3.0)
Eosinophils Absolute: 0.1 10*3/uL (ref 0.0–0.7)
Eosinophils Relative: 1.3 % (ref 0.0–5.0)
HCT: 45.7 % (ref 39.0–52.0)
Hemoglobin: 15.6 g/dL (ref 13.0–17.0)
Lymphocytes Relative: 26.1 % (ref 12.0–46.0)
Lymphs Abs: 1.4 10*3/uL (ref 0.7–4.0)
MCHC: 34.1 g/dL (ref 30.0–36.0)
MCV: 88.5 fl (ref 78.0–100.0)
Monocytes Absolute: 0.6 10*3/uL (ref 0.1–1.0)
Monocytes Relative: 11.4 % (ref 3.0–12.0)
Neutro Abs: 3.2 10*3/uL (ref 1.4–7.7)
Neutrophils Relative %: 59.9 % (ref 43.0–77.0)
Platelets: 195 10*3/uL (ref 150.0–400.0)
RBC: 5.17 Mil/uL (ref 4.22–5.81)
RDW: 13.5 % (ref 11.5–15.5)
WBC: 5.3 10*3/uL (ref 4.0–10.5)

## 2021-11-25 LAB — LIPID PANEL
Cholesterol: 190 mg/dL (ref 0–200)
HDL: 43.3 mg/dL (ref >39.00)
LDL Cholesterol: 119 mg/dL — ABNORMAL HIGH (ref 0–99)
NonHDL: 146.85
Total CHOL/HDL Ratio: 4
Triglycerides: 137 mg/dL (ref 0.0–149.0)
VLDL: 27.4 mg/dL (ref 0.0–40.0)

## 2021-11-25 LAB — COMPREHENSIVE METABOLIC PANEL
ALT: 21 U/L (ref 0–53)
AST: 22 U/L (ref 0–37)
Albumin: 4.8 g/dL (ref 3.5–5.2)
Alkaline Phosphatase: 64 U/L (ref 39–117)
BUN: 15 mg/dL (ref 6–23)
CO2: 29 mEq/L (ref 19–32)
Calcium: 9.4 mg/dL (ref 8.4–10.5)
Chloride: 100 mEq/L (ref 96–112)
Creatinine, Ser: 1.16 mg/dL (ref 0.40–1.50)
GFR: 71.54 mL/min (ref >60.00)
Glucose, Bld: 97 mg/dL (ref 70–99)
Potassium: 4.1 mEq/L (ref 3.5–5.1)
Sodium: 138 mEq/L (ref 135–145)
Total Bilirubin: 0.8 mg/dL (ref 0.2–1.2)
Total Protein: 6.8 g/dL (ref 6.0–8.3)

## 2021-11-25 LAB — PSA: PSA: 2.04 ng/mL (ref 0.10–4.00)

## 2021-11-25 LAB — MICROALBUMIN / CREATININE URINE RATIO
Creatinine,U: 105.8 mg/dL
Microalb Creat Ratio: 0.7 mg/g (ref 0.0–30.0)
Microalb, Ur: <0.7 mg/dL (ref 0.0–1.9)

## 2021-11-25 LAB — TSH: TSH: 2.81 u[IU]/mL (ref 0.35–5.50)

## 2021-11-25 LAB — HEMOGLOBIN A1C: Hgb A1c MFr Bld: 5.2 % (ref 4.6–6.5)

## 2021-11-26 ENCOUNTER — Other Ambulatory Visit: Payer: BC Managed Care – PPO

## 2021-12-02 ENCOUNTER — Ambulatory Visit (INDEPENDENT_AMBULATORY_CARE_PROVIDER_SITE_OTHER): Payer: BC Managed Care – PPO | Admitting: Internal Medicine

## 2021-12-02 ENCOUNTER — Encounter: Payer: Self-pay | Admitting: Internal Medicine

## 2021-12-02 ENCOUNTER — Telehealth: Payer: Self-pay

## 2021-12-02 VITALS — BP 126/82 | HR 64 | Temp 98.0°F | Ht 72.0 in | Wt 207.8 lb

## 2021-12-02 DIAGNOSIS — Z0000 Encounter for general adult medical examination without abnormal findings: Secondary | ICD-10-CM | POA: Diagnosis not present

## 2021-12-02 DIAGNOSIS — E663 Overweight: Secondary | ICD-10-CM | POA: Diagnosis not present

## 2021-12-02 DIAGNOSIS — I10 Essential (primary) hypertension: Secondary | ICD-10-CM

## 2021-12-02 DIAGNOSIS — Z125 Encounter for screening for malignant neoplasm of prostate: Secondary | ICD-10-CM

## 2021-12-02 DIAGNOSIS — K580 Irritable bowel syndrome with diarrhea: Secondary | ICD-10-CM | POA: Diagnosis not present

## 2021-12-02 DIAGNOSIS — R7301 Impaired fasting glucose: Secondary | ICD-10-CM

## 2021-12-02 DIAGNOSIS — F418 Other specified anxiety disorders: Secondary | ICD-10-CM

## 2021-12-02 DIAGNOSIS — R5383 Other fatigue: Secondary | ICD-10-CM

## 2021-12-02 DIAGNOSIS — E785 Hyperlipidemia, unspecified: Secondary | ICD-10-CM

## 2021-12-02 NOTE — Progress Notes (Signed)
The patient is here for annual preventive examination and management of other chronic and acute problems.   The risk factors are reflected in the social history.   The roster of all physicians providing medical care to patient - is listed in the Snapshot section of the chart.   Activities of daily living:  The patient is 100% independent in all ADLs: dressing, toileting, feeding as well as independent mobility   Home safety : The patient has smoke detectors in the home. They wear seatbelts.  There are no unsecured firearms at home. There is no violence in the home.    There is no risks for hepatitis, STDs or HIV. There is no   history of blood transfusion. They have no travel history to infectious disease endemic areas of the world.   The patient has seen their dentist in the last six month. They have seen their eye doctor in the last year. The patinet  denies slight hearing difficulty with regard to whispered voices and some television programs.  They have deferred audiologic testing in the last year.  They do not  have excessive sun exposure. Discussed the need for sun protection: hats, long sleeves and use of sunscreen if there is significant sun exposure.    Diet: the importance of a healthy diet is discussed. They do have a healthy diet.   The benefits of regular aerobic exercise were discussed. The patient  exercises  3 to 5 days per week  for  60 minutes.    Depression screen: there are no signs or vegative symptoms of depression- irritability, change in appetite, anhedonia, sadness/tearfullness.   The following portions of the patient's history were reviewed and updated as appropriate: allergies, current medications, past family history, past medical history,  past surgical history, past social history  and problem list.   Visual acuity was not assessed per patient preference since the patient has regular follow up with an  ophthalmologist. Hearing and body mass index were assessed and  reviewed.    During the course of the visit the patient was educated and counseled about appropriate screening and preventive services including : fall prevention , diabetes screening, nutrition counseling, colorectal cancer screening, and recommended immunizations.    Chief Complaint:  1) anxiety secondary to family stressors :  wife is out of town and he is responsible for  managing 6 children .  Has reduced alcohol consumption .  Has considered seeing a psychiatrist but was never able to make contact with Dr Nicolasa Ducking .  Stopped the wellbutrin and buspirone bc it did not help. Using meditation and Head space app but sleep still an issue.  Using melatonin 5 to 10 mg with good results. Has episodes of situational panic   2) exercising regularly,  following a careful diet except during the last week on vacation with all kids home.  Reviewed his BMI,  labs.      Review of Symptoms  Patient denies headache, fevers, malaise, unintentional weight loss, skin rash, eye pain, sinus congestion and sinus pain, sore throat, dysphagia,  hemoptysis , cough, dyspnea, wheezing, chest pain, palpitations, orthopnea, edema, abdominal pain, nausea, melena, diarrhea, constipation, flank pain, dysuria, hematuria, urinary  Frequency, nocturia, numbness, tingling, seizures,  Focal weakness, Loss of consciousness,  Tremor, , depression,, and suicidal ideation.    Physical Exam:  BP 126/82 (BP Location: Left Arm, Patient Position: Sitting, Cuff Size: Large)   Pulse 64   Temp 98 F (36.7 C) (Oral)   Ht 6' (  1.829 m)   Wt 207 lb 12.8 oz (94.3 kg)   SpO2 98%   BMI 28.18 kg/m    General appearance: alert, cooperative and appears stated age Ears: normal TM's and external ear canals both ears Throat: lips, mucosa, and tongue normal; teeth and gums normal Neck: no adenopathy, no carotid bruit, supple, symmetrical, trachea midline and thyroid not enlarged, symmetric, no tenderness/mass/nodules Back: symmetric, no  curvature. ROM normal. No CVA tenderness. Lungs: clear to auscultation bilaterally Heart: regular rate and rhythm, S1, S2 normal, no murmur, click, rub or gallop Abdomen: soft, non-tender; bowel sounds normal; no masses,  no organomegaly Pulses: 2+ and symmetric Skin: Skin color, texture, turgor normal. No rashes or lesions Lymph nodes: Cervical, supraclavicular, and axillary nodes normal.   Assessment and Plan:  Prostate cancer screening PSA is normal   Irritable bowel syndrome Managed with dietary restrictions.   Anxiety with depression Depressive symptoms have resolved.  His anxiety  is aggravated by unresolved conflict with ex wife, financial strain , but he prefers to avoid any more medication trials at this time.   Overweight (BMI 25.0-29.9) Reviewed BMI formula, discussed the inadequacies of the formula in assessing health for muscle bound individual.   Encounter for preventive health examination age appropriate education and counseling updated, referrals for preventative services and immunizations addressed, dietary and smoking counseling addressed, most recent labs reviewed.  I have personally reviewed and have noted:   1) the patient's medical and social history 2) The pt's use of alcohol, tobacco, and illicit drugs 3) The patient's current medications and supplements 4) Functional ability including ADL's, fall risk, home safety risk, hearing and visual impairment 5) Diet and physical activities 6) Evidence for depression or mood disorder 7) The patient's height, weight, and BMI have been recorded in the chart   I have made referrals, and provided counseling and education based on review of the above  Elevated blood pressure reading with diagnosis of hypertension Readings have been normal, and urine is negative for proteinuria.   Lab Results  Component Value Date   CREATININE 1.16 11/25/2021   Lab Results  Component Value Date   MICROALBUR <0.7 11/25/2021    MICROALBUR <0.7 11/20/2020      Updated Medication List Outpatient Encounter Medications as of 12/02/2021  Medication Sig   Loperamide HCl (IMODIUM PO) Take 1 tablet by mouth daily at 6 (six) AM.   MELATONIN PO Take 1 tablet by mouth at bedtime as needed.   [DISCONTINUED] buPROPion (WELLBUTRIN XL) 150 MG 24 hr tablet Take 1 tablet (150 mg total) by mouth daily. (Patient not taking: Reported on 12/02/2021)   [DISCONTINUED] busPIRone (BUSPAR) 15 MG tablet TAKE 1 TABLET BY MOUTH TWICE DAILY (Patient not taking: Reported on 12/02/2021)   No facility-administered encounter medications on file as of 12/02/2021.

## 2021-12-02 NOTE — Telephone Encounter (Signed)
Patient states at [facility] that he would like to have his labs drawn before his appointment for his physical on 12/06/2022.  I let patient know we do not have the orders for the labs yet, so I will put a request in to Dr. Deborra Medina for lab orders.

## 2021-12-02 NOTE — Assessment & Plan Note (Signed)
Depressive symptoms have resolved.  His anxiety  is aggravated by unresolved conflict with ex wife, financial strain , but he prefers to avoid any more medication trials at this time.

## 2021-12-02 NOTE — Assessment & Plan Note (Signed)
Readings have been normal, and urine is negative for proteinuria.   Lab Results  Component Value Date   CREATININE 1.16 11/25/2021   Lab Results  Component Value Date   MICROALBUR <0.7 11/25/2021   MICROALBUR <0.7 11/20/2020

## 2021-12-02 NOTE — Patient Instructions (Addendum)
Your PSA<  A1c,  thyroid ,cholesterol,  liver and kidney function are all excellent.     Please Plan to repeat fasting labs in 1 year.    You might want to try using Relaxium for insomnia  (as seen on TV commercials) . It contains:  Melatonin 5 mg  Chamomile 25 mg Passionflower extract 75 mg GABA 100 mg Ashwaganda extract 125 mg Magnesium citrate, glycinate, oxide (100 mg)  L tryptophan 500 mg Valerest (proprietary  ingredient ; probably valeria root extract)

## 2021-12-02 NOTE — Assessment & Plan Note (Signed)

## 2021-12-02 NOTE — Assessment & Plan Note (Signed)
Managed with dietary restrictions.

## 2021-12-02 NOTE — Assessment & Plan Note (Signed)
PSA is normal.

## 2021-12-02 NOTE — Assessment & Plan Note (Addendum)
Reviewed BMI formula, discussed the inadequacies of the formula in assessing health for muscle bound individual.

## 2021-12-03 NOTE — Telephone Encounter (Signed)
Labs are ordered and appt is scheduled.

## 2022-11-17 ENCOUNTER — Encounter (INDEPENDENT_AMBULATORY_CARE_PROVIDER_SITE_OTHER): Payer: Self-pay

## 2022-12-02 ENCOUNTER — Other Ambulatory Visit: Payer: BC Managed Care – PPO

## 2022-12-02 ENCOUNTER — Encounter (INDEPENDENT_AMBULATORY_CARE_PROVIDER_SITE_OTHER): Payer: Self-pay

## 2022-12-06 ENCOUNTER — Encounter: Payer: BC Managed Care – PPO | Admitting: Internal Medicine

## 2022-12-22 ENCOUNTER — Telehealth: Payer: Self-pay | Admitting: Internal Medicine

## 2022-12-22 NOTE — Telephone Encounter (Signed)
Patient need lab orders.

## 2022-12-27 ENCOUNTER — Telehealth: Payer: Self-pay | Admitting: Internal Medicine

## 2022-12-27 ENCOUNTER — Other Ambulatory Visit (INDEPENDENT_AMBULATORY_CARE_PROVIDER_SITE_OTHER): Payer: 59

## 2022-12-27 ENCOUNTER — Telehealth: Payer: Self-pay

## 2022-12-27 DIAGNOSIS — E785 Hyperlipidemia, unspecified: Secondary | ICD-10-CM | POA: Diagnosis not present

## 2022-12-27 DIAGNOSIS — Z125 Encounter for screening for malignant neoplasm of prostate: Secondary | ICD-10-CM

## 2022-12-27 DIAGNOSIS — Z0000 Encounter for general adult medical examination without abnormal findings: Secondary | ICD-10-CM

## 2022-12-27 LAB — CBC WITH DIFFERENTIAL/PLATELET
Basophils Absolute: 0.1 10*3/uL (ref 0.0–0.1)
Basophils Relative: 1.1 % (ref 0.0–3.0)
Eosinophils Absolute: 0 10*3/uL (ref 0.0–0.7)
Eosinophils Relative: 0.8 % (ref 0.0–5.0)
HCT: 46.9 % (ref 39.0–52.0)
Hemoglobin: 15.8 g/dL (ref 13.0–17.0)
Lymphocytes Relative: 18.5 % (ref 12.0–46.0)
Lymphs Abs: 1.1 10*3/uL (ref 0.7–4.0)
MCHC: 33.6 g/dL (ref 30.0–36.0)
MCV: 87.7 fl (ref 78.0–100.0)
Monocytes Absolute: 0.7 10*3/uL (ref 0.1–1.0)
Monocytes Relative: 11.1 % (ref 3.0–12.0)
Neutro Abs: 4 10*3/uL (ref 1.4–7.7)
Neutrophils Relative %: 68.5 % (ref 43.0–77.0)
Platelets: 215 10*3/uL (ref 150.0–400.0)
RBC: 5.35 Mil/uL (ref 4.22–5.81)
RDW: 13.4 % (ref 11.5–15.5)
WBC: 5.9 10*3/uL (ref 4.0–10.5)

## 2022-12-27 LAB — LIPID PANEL
Cholesterol: 210 mg/dL — ABNORMAL HIGH (ref 0–200)
HDL: 53.9 mg/dL (ref >39.00)
LDL Cholesterol: 123 mg/dL — ABNORMAL HIGH (ref 0–99)
NonHDL: 155.83
Total CHOL/HDL Ratio: 4
Triglycerides: 165 mg/dL — ABNORMAL HIGH (ref 0.0–149.0)
VLDL: 33 mg/dL (ref 0.0–40.0)

## 2022-12-27 LAB — COMPREHENSIVE METABOLIC PANEL
ALT: 25 U/L (ref 0–53)
AST: 24 U/L (ref 0–37)
Albumin: 4.6 g/dL (ref 3.5–5.2)
Alkaline Phosphatase: 70 U/L (ref 39–117)
BUN: 16 mg/dL (ref 6–23)
CO2: 31 meq/L (ref 19–32)
Calcium: 9.4 mg/dL (ref 8.4–10.5)
Chloride: 99 meq/L (ref 96–112)
Creatinine, Ser: 1.12 mg/dL (ref 0.40–1.50)
GFR: 74.05 mL/min (ref >60.00)
Glucose, Bld: 102 mg/dL — ABNORMAL HIGH (ref 70–99)
Potassium: 4.3 meq/L (ref 3.5–5.1)
Sodium: 138 meq/L (ref 135–145)
Total Bilirubin: 0.9 mg/dL (ref 0.2–1.2)
Total Protein: 7 g/dL (ref 6.0–8.3)

## 2022-12-27 LAB — HEMOGLOBIN A1C: Hgb A1c MFr Bld: 5.1 % (ref 4.6–6.5)

## 2022-12-27 LAB — TSH: TSH: 3.06 u[IU]/mL (ref 0.35–5.50)

## 2022-12-27 LAB — MICROALBUMIN / CREATININE URINE RATIO
Creatinine,U: 20.5 mg/dL
Microalb Creat Ratio: 3.4 mg/g (ref 0.0–30.0)
Microalb, Ur: <0.7 mg/dL (ref 0.0–1.9)

## 2022-12-27 LAB — PSA: PSA: 2.79 ng/mL (ref 0.10–4.00)

## 2022-12-27 NOTE — Telephone Encounter (Signed)
Lab orders placed.  

## 2022-12-27 NOTE — Telephone Encounter (Signed)
Patient need lab orders.

## 2022-12-28 ENCOUNTER — Telehealth: Payer: Self-pay

## 2022-12-28 NOTE — Telephone Encounter (Signed)
LMTCB in regards to lab results.  

## 2022-12-28 NOTE — Telephone Encounter (Signed)
-----   Message from Eulis Foster sent at 12/27/2022  9:36 PM EDT ----- Labs stable except cholesterol up a slightly  from a year ago. Watch your intake of white breads, pasta, potatoes, etc. Exercise at least 45 min 4 days a week. HDL (good) cholesterol looks great!

## 2022-12-29 ENCOUNTER — Encounter: Payer: Self-pay | Admitting: Internal Medicine

## 2022-12-29 ENCOUNTER — Ambulatory Visit: Payer: 59 | Admitting: Internal Medicine

## 2022-12-29 VITALS — BP 136/84 | HR 78 | Temp 98.5°F | Ht 72.0 in | Wt 215.8 lb

## 2022-12-29 DIAGNOSIS — F6381 Intermittent explosive disorder: Secondary | ICD-10-CM | POA: Diagnosis not present

## 2022-12-29 DIAGNOSIS — F418 Other specified anxiety disorders: Secondary | ICD-10-CM | POA: Diagnosis not present

## 2022-12-29 DIAGNOSIS — E663 Overweight: Secondary | ICD-10-CM

## 2022-12-29 DIAGNOSIS — R972 Elevated prostate specific antigen [PSA]: Secondary | ICD-10-CM | POA: Diagnosis not present

## 2022-12-29 DIAGNOSIS — Z0000 Encounter for general adult medical examination without abnormal findings: Secondary | ICD-10-CM | POA: Diagnosis not present

## 2022-12-29 DIAGNOSIS — I10 Essential (primary) hypertension: Secondary | ICD-10-CM

## 2022-12-29 MED ORDER — SERTRALINE HCL 50 MG PO TABS: 50.0000 mg | ORAL_TABLET | Freq: Every day | ORAL | 3 refills | Status: DC

## 2022-12-29 NOTE — Progress Notes (Unsigned)
Patient ID: Travis Bennett, male    DOB: 05-20-1965  Age: 57 y.o. MRN: [id number]  The patient is here for annual preventive examination and management of other chronic and acute problems.   The risk factors are reflected in the social history.   The roster of all physicians providing medical care to patient - is listed in the Snapshot section of the chart.   Activities of daily living:  The patient is 100% independent in all ADLs: dressing, toileting, feeding as well as independent mobility   Home safety : The patient has smoke detectors in the home. They wear seatbelts.  There are no unsecured firearms at home. There is no violence in the home.    There is no risks for hepatitis, STDs or HIV. There is no   history of blood transfusion. They have no travel history to infectious disease endemic areas of the world.   The patient has seen their dentist in the last six month. They have seen their eye doctor in the last year. The patinet  denies slight hearing difficulty with regard to whispered voices and some television programs.  They have deferred audiologic testing in the last year.  They do not  have excessive sun exposure. Discussed the need for sun protection: hats, long sleeves and use of sunscreen if there is significant sun exposure.    Diet: the importance of a healthy diet is discussed. They do have a healthy diet.   The benefits of regular aerobic exercise were discussed. The patient  exercises  3 to 5 days per week  for  60 minutes.    Depression screen: there are no signs or vegative symptoms of depression- irritability, change in appetite, anhedonia, sadness/tearfullness.   The following portions of the patient's history were reviewed and updated as appropriate: allergies, current medications, past family history, past medical history,  past surgical history, past social history  and problem list.   Visual acuity was not assessed per patient preference since the patient has regular  follow up with an  ophthalmologist. Hearing and body mass index were assessed and reviewed.    During the course of the visit the patient was educated and counseled about appropriate screening and preventive services including : fall prevention , diabetes screening, nutrition counseling, colorectal cancer screening, and recommended immunizations.    Chief Complaint:  4 kids in college,  one 15 yr at home palmer is in college [employer]  with the [employer] Elevated blood pressure .  H/o htn has not been treated in a few years due to patient preference.  Attributed to stress, but stress level Is better Weight gain of 8 lbs since last year.   Last 3 months have been busy  Anxiety:  has been researching :  Behavioral therapy referral to [facility] not possible due to insurance change. Wants medications and a referral for intermittent explosive disorder symptoms .  Prefers face to face / in person    Review of Symptoms  Patient denies headache, fevers, malaise, unintentional weight loss, skin rash, eye pain, sinus congestion and sinus pain, sore throat, dysphagia,  hemoptysis , cough, dyspnea, wheezing, chest pain, palpitations, orthopnea, edema, abdominal pain, nausea, melena, diarrhea, constipation, flank pain, dysuria, hematuria, urinary  Frequency, nocturia, numbness, tingling, seizures,  Focal weakness, Loss of consciousness,  Tremor, insomnia, depression, anxiety, and suicidal ideation.    Physical Exam:  BP 136/84   Pulse 78   Temp 98.5 F (36.9 C) (Oral)   Ht 6' (1.829  m)   Wt 215 lb 12.8 oz (97.9 kg)   SpO2 98%   BMI 29.27 kg/m    Physical Exam Vitals reviewed.  Constitutional:      General: He is not in acute distress.    Appearance: Normal appearance. He is normal weight. He is not ill-appearing, toxic-appearing or diaphoretic.  HENT:     Head: Normocephalic and atraumatic.     Right Ear: Tympanic membrane, ear canal and external ear normal. There is no impacted cerumen.     Left Ear:  Tympanic membrane, ear canal and external ear normal. There is no impacted cerumen.     Nose: Nose normal.     Mouth/Throat:     Mouth: Mucous membranes are moist.     Pharynx: Oropharynx is clear.  Eyes:     General: No scleral icterus.       Right eye: No discharge.        Left eye: No discharge.     Conjunctiva/sclera: Conjunctivae normal.  Neck:     Thyroid: No thyromegaly.     Vascular: No carotid bruit or JVD.  Cardiovascular:     Rate and Rhythm: Normal rate and regular rhythm.     Heart sounds: Normal heart sounds.  Pulmonary:     Effort: Pulmonary effort is normal. No respiratory distress.     Breath sounds: Normal breath sounds.  Abdominal:     General: Bowel sounds are normal.     Palpations: Abdomen is soft. There is no mass.     Tenderness: There is no abdominal tenderness. There is no guarding or rebound.  Musculoskeletal:        General: Normal range of motion.     Cervical back: Normal range of motion and neck supple.  Lymphadenopathy:     Cervical: No cervical adenopathy.  Skin:    General: Skin is warm and dry.  Neurological:     General: No focal deficit present.     Mental Status: He is alert and oriented to person, place, and time. Mental status is at baseline.  Psychiatric:        Mood and Affect: Mood normal.        Behavior: Behavior normal.        Thought Content: Thought content normal.        Judgment: Judgment normal.    Assessment and Plan: Encounter for preventive health examination Assessment & Plan: age appropriate education and counseling updated, referrals for preventative services and immunizations addressed, dietary and smoking counseling addressed, most recent labs reviewed.  I have personally reviewed and have noted:   1) the patient's medical and social history 2) The pt's use of alcohol, tobacco, and illicit drugs 3) The patient's current medications and supplements 4) Functional ability including ADL's, fall risk, home safety  risk, hearing and visual impairment 5) Diet and physical activities 6) Evidence for depression or mood disorder 7) The patient's height, weight, and BMI have been recorded in the chart   I have made referrals, and provided counseling and education based on review of the above.  10 yr risk of CAD using the AH cardiac risk calcuator is 6%    Increased prostate specific antigen (PSA) velocity -     Ambulatory referral to Urology  Intermittent explosive personality -     Ambulatory referral to Psychiatry  Anxiety with depression Assessment & Plan: Resuming therapy with sertraline,  referring to [facility] for ongoing treatment    Elevated blood pressure reading  with diagnosis of hypertension Assessment & Plan: He has a prior history of hypertension. He  has been advised t o check his blood pressure several times over the next 3-4 weeks and to submit readings for evaluation. Urine microalbumin to creatinine ratio done today is normal.  Lab Results  Component Value Date   MICROALBUR <0.7 12/27/2022   MICROALBUR <0.7 11/25/2021  4 Lab Results  Component Value Date   CREATININE 1.12 12/27/2022   Lab Results  Component Value Date   NA 138 12/27/2022   K 4.3 12/27/2022   CL 99 12/27/2022   CO2 31 12/27/2022         Overweight (BMI 25.0-29.9) Assessment & Plan: I have addressed  BMI and recommended wt loss of 10% of body weigh over the next 6 months using a low glycemic index diet and regular exercise a minimum of 5 days per week.     Other orders -     Sertraline HCl; Take 1 tablet (50 mg total) by mouth daily.  Dispense: 30 tablet; Refill: 3    Return in about 6 months (around 06/28/2023).  Sherlene Shams, MD

## 2022-12-29 NOTE — Assessment & Plan Note (Signed)
age appropriate education and counseling updated, referrals for preventative services and immunizations addressed, dietary and smoking counseling addressed, most recent labs reviewed.  I have personally reviewed and have noted:   1) the patient's medical and social history 2) The pt's use of alcohol, tobacco, and illicit drugs 3) The patient's current medications and supplements 4) Functional ability including ADL's, fall risk, home safety risk, hearing and visual impairment 5) Diet and physical activities 6) Evidence for depression or mood disorder 7) The patient's height, weight, and BMI have been recorded in the chart   I have made referrals, and provided counseling and education based on review of the above.  10 yr risk of CAD using the AH cardiac risk calcuator is 6%

## 2022-12-29 NOTE — Patient Instructions (Addendum)
  The new goals for optimal blood pressure management are 120/70.  Please check your blood pressure a few times at home and send me the readings so I can determine if you need  TO START medication    Referral to Dr Elna Breslow at [facility] for the anxiety disorder    Please start the  Sertraline (generic for Zoloft) at 1/2 tablet daily in the morning with breakfast for the first week to avoid nausea.  You can increase to a full tablet after 1 week if you havenot developed side effects of nausea.    You should start to feel a difference after two weeks on the full dose      Your PSA , although normal,  has increased by 0.75  over the past year.  PSA velocity of this degree is associated with an increased risk of  prostate cancer; therefore I am recommending an evaluation by a urologist and put this in place

## 2022-12-30 NOTE — Assessment & Plan Note (Signed)
Resuming therapy with sertraline,  referring to [facility] for ongoing treatment

## 2022-12-30 NOTE — Assessment & Plan Note (Addendum)
He has a prior history of hypertension. He  has been advised t o check his blood pressure several times over the next 3-4 weeks and to submit readings for evaluation. Urine microalbumin to creatinine ratio done today is normal.  Lab Results  Component Value Date   MICROALBUR <0.7 12/27/2022   MICROALBUR <0.7 11/25/2021  4 Lab Results  Component Value Date   CREATININE 1.12 12/27/2022   Lab Results  Component Value Date   NA 138 12/27/2022   K 4.3 12/27/2022   CL 99 12/27/2022   CO2 31 12/27/2022

## 2022-12-30 NOTE — Assessment & Plan Note (Signed)
I have addressed  BMI and recommended wt loss of 10% of body weigh over the next 6 months using a low glycemic index diet and regular exercise a minimum of 5 days per week.   

## 2023-01-18 ENCOUNTER — Encounter: Payer: Self-pay | Admitting: Urology

## 2023-01-18 ENCOUNTER — Ambulatory Visit: Payer: 59 | Admitting: Urology

## 2023-01-18 VITALS — BP 151/96 | HR 73 | Ht 72.0 in | Wt 208.0 lb

## 2023-01-18 DIAGNOSIS — R972 Elevated prostate specific antigen [PSA]: Secondary | ICD-10-CM | POA: Diagnosis not present

## 2023-01-18 DIAGNOSIS — N529 Male erectile dysfunction, unspecified: Secondary | ICD-10-CM | POA: Diagnosis not present

## 2023-01-18 DIAGNOSIS — Z125 Encounter for screening for malignant neoplasm of prostate: Secondary | ICD-10-CM

## 2023-01-18 MED ORDER — TADALAFIL 5 MG PO TABS: 5.0000 mg | ORAL_TABLET | Freq: Every day | ORAL | 6 refills | Status: DC | PRN

## 2023-01-18 NOTE — Progress Notes (Signed)
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  This prompted a CT abdomen pelvis to evaluate for any upstream pathology which was benign.  Prostate measured 30 g on CT at that time.  He is referred for a slow increase in the PSA.  Most recent PSA from September 2024 was normal at 2.79, prior values of 2 in 2023, 1.6 in 2022, 1.2 in 2020, 0.75 in 2014.  He denies any family history of prostate cancer.  He reports some new ED since starting Zoloft, interested in medications.   PMH: Past Medical History:  Diagnosis Date   Anxiety    not on meds at this time (03/17/2021)   Blood in stool    Chicken pox    Chronic headaches    Depression    not on meds at this time (03/17/2021)   Hypertension    not taking meds since 02/2020   Irritable bowel syndrome with diarrhea    Seasonal allergies     Surgical History: Past Surgical History:  Procedure Laterality Date   ANAL FISTULECTOMY  1995   COLONOSCOPY  2014   at [facility]-exc prep-recall 59yrs-normal   COLONOSCOPY  2009   [place]   TONSILLECTOMY AND ADENOIDECTOMY  1979    Family History: Family History  Problem Relation Age of Onset   Hyperlipidemia Mother    Colon polyps Father 27   Cancer Father        throat Ca,  tobacco    Early death Maternal Aunt    Cancer Maternal Grandmother        Ovarian   Alcohol abuse Maternal Grandmother    Bladder Cancer Maternal Grandmother    Heart disease Maternal Grandfather 32       [facility]    Alzheimer's disease Paternal Grandmother    Cancer Paternal Grandfather        colon   Heart disease Paternal Grandfather    Celiac disease Daughter    Prostate cancer Neg Hx    Kidney cancer Neg Hx    Colon cancer Neg Hx    Esophageal cancer Neg Hx    Rectal cancer Neg Hx    Stomach cancer Neg Hx     Social History:  reports  that he has never smoked. He has never used smokeless tobacco. He reports current alcohol use of about 7.0 standard drinks of alcohol per week. He reports that he does not use drugs.  Physical Exam: BP (!) 151/96 (BP Location: Left Arm, Patient Position: Sitting, Cuff Size: Normal)   Pulse 73   Ht 6' (1.829 m)   Wt 208 lb (94.3 kg)   BMI 28.21 kg/m    Constitutional:  Alert and oriented, No acute distress. Cardiovascular: No clubbing, cyanosis, or edema. Respiratory: Normal respiratory effort, no increased work of breathing. GI: Abdomen is soft, nontender, nondistended, no abdominal masses   Laboratory Data: PSA history reviewed, see HPI  Assessment & Plan:   57 year old male referred for slowly rising PSA.  We reviewed the AUA guidelines regarding risk and benefits of PSA screening, as well as possible causes of false elevation.  We reviewed at length that the AUA guidelines do not recommend biopsy or MRI based on PSA velocity alone, and I would recommend continuing to monitor the PSA moving forward, consider PSA reflex to free in the future.  Reassurance provided regarding  normal PSA density of 0.09 based on a prostate volume of 30 g from CT in 2019.  He also has some mild ED on Zoloft and is interested in a trial of Cialis, risk and benefits discussed.  Trial of Cialis 5 mg on demand for ED We discussed timing of repeat PSA, and he opted for repeat PSA in 1 year with PCP  Legrand Rams, MD 01/18/2023  [facility] [address] [place], [place] [address] [phone]

## 2023-01-18 NOTE — Patient Instructions (Addendum)
Prostate Cancer Screening  Prostate cancer screening is testing that is done to check for the presence of prostate cancer in men. The prostate gland is a walnut-sized gland that is located below the bladder and in front of the rectum in males. The function of the prostate is to add fluid to semen during ejaculation. Prostate cancer is one of the most common types of cancer in men. Who should have prostate cancer screening? Screening recommendations vary based on age and other risk factors, as well as between the professional organizations who make the recommendations. In general, screening is recommended if: You are age 72 to 54 and have an average risk for prostate cancer. You should talk with your health care provider about your need for screening and how often screening should be done. Because most prostate cancers are slow growing and will not cause death, screening in this age group is generally reserved for men who have a 10- to 15-year life expectancy. You are younger than age 84, and you have these risk factors: Having a father, brother, or uncle who has been diagnosed with prostate cancer. The risk is higher if your family member's cancer occurred at an early age or if you have multiple family members with prostate cancer at an early age. Being a male who is [place] or is of [place] or sub-Saharan African descent. In general, screening is not recommended if: You are younger than age 19. You are between the ages of 41 and 64 and you have no risk factors. You are 26 years of age or older. At this age, the risks that screening can cause are greater than the benefits that it may provide. If you are at high risk for prostate cancer, your health care provider may recommend that you have screenings more often or that you start screening at a younger age. How is screening for prostate cancer done? The recommended prostate cancer screening test is a blood test called the prostate-specific antigen  (PSA) test. PSA is a protein that is made in the prostate. As you age, your prostate naturally produces more PSA. Abnormally high PSA levels may be caused by: Prostate cancer. An enlarged prostate that is not caused by cancer (benign prostatic hyperplasia, or BPH). This condition is very common in older men. A prostate gland infection (prostatitis) or urinary tract infection. Certain medicines such as male hormones (like testosterone) or other medicines that raise testosterone levels. A rectal exam may be done as part of prostate cancer screening to help provide information about the size of your prostate gland. When a rectal exam is performed, it should be done after the PSA level is drawn to avoid any effect on the results. Depending on the PSA results, you may need more tests, such as: A physical exam to check the size of your prostate gland, if not done as part of screening. Blood and imaging tests. A procedure to remove tissue samples from your prostate gland for testing (biopsy). This is the only way to know for certain if you have prostate cancer. What are the benefits of prostate cancer screening? Screening can help to identify cancer at an early stage, before symptoms start and when the cancer can be treated more easily. There is a small chance that screening may lower your risk of dying from prostate cancer. The chance is small because prostate cancer is a slow-growing cancer, and most men with prostate cancer die from a different cause. What are the risks of prostate cancer screening? The  main risk of prostate cancer screening is diagnosing and treating prostate cancer that would never have caused any symptoms or problems. This is called overdiagnosisand overtreatment. PSA screening cannot tell you if your PSA is high due to cancer or a different cause. A prostate biopsy is the only procedure to diagnose prostate cancer. Even the results of a biopsy may not tell you if your cancer needs to  be treated. Slow-growing prostate cancer may not need any treatment other than monitoring, so diagnosing and treating it may cause unnecessary stress or other side effects. Questions to ask your health care provider When should I start prostate cancer screening? What is my risk for prostate cancer? How often do I need screening? What type of screening tests do I need? How do I get my test results? What do my results mean? Do I need treatment? Where to find more information The American Cancer Society: www.cancer.org American Urological Association: www.auanet.org Contact a health care provider if: You have difficulty urinating. You have pain when you urinate or ejaculate. You have blood in your urine or semen. You have pain in your back or in the area of your prostate. Summary Prostate cancer is a common type of cancer in men. The prostate gland is located below the bladder and in front of the rectum. This gland adds fluid to semen during ejaculation. Prostate cancer screening may identify cancer at an early stage, when the cancer can be treated more easily and is less likely to have spread to other areas of the body. The prostate-specific antigen (PSA) test is the recommended screening test for prostate cancer, but it has associated risks. Discuss the risks and benefits of prostate cancer screening with your health care provider. If you are age 59 or older, the risks that screening can cause are greater than the benefits that it may provide. This information is not intended to replace advice given to you by your health care provider. Make sure you discuss any questions you have with your health care provider. Document Revised: 09/29/2020 Document Reviewed: 09/29/2020 Elsevier Patient Education  2024 Elsevier Inc.   The Benefits of a Plant-Based Diet for Urology Health  A plant-based diet emphasizes the consumption of whole, unprocessed plant foods while minimizing or excluding animal  products including meat and dairy products. This dietary approach has gained attention for its potential to promote overall health, including urology-related conditions. Incorporating a plant-based diet into your lifestyle can offer numerous benefits for maintaining optimal urology health.  1. Reduced Risk of Kidney Stones: A plant-based diet is typically rich in fruits, vegetables, legumes, and whole grains. These foods are high in dietary fiber, potassium, and magnesium, which can help reduce the risk of developing kidney stones. Be careful to avoid high quantities of spinach, as these can contribute to kidney stone formation if eaten in large volumes. The increased intake of water-soluble fiber can enhance the excretion of waste products and prevent the crystallization of minerals that lead to stone formation.  2. Improved Prostate Health: Studies have suggested a link between the consumption of red and processed meats and an increased risk of prostate problems, including benign prostatic hyperplasia (BPH) and prostate cancer. By adopting a plant-based diet, you can lower your intake of saturated fats and decrease the risk of these conditions. PSA levels can often decrease on plant based diets! Plant foods are also rich in antioxidants and phytochemicals that have been associated with prostate health.  3. Better Bladder Function: A diet focused on plant-based  foods can contribute to better bladder health by reducing the risk of urinary tract infections (UTIs). Berries, citrus fruits, and leafy greens are known for their high vitamin C content, which can acidify urine and create an environment less favorable for bacteria growth. Additionally, plant-based diets are generally lower in sodium, which can help prevent fluid retention and reduce the strain on the bladder.  4. Management of Erectile Dysfunction (ED): Some research suggests that a plant-based diet can positively impact erectile function.  Plant-based diets are associated with improved cardiovascular health, which is crucial for maintaining healthy blood flow and nerve function required for proper erectile function. By reducing the consumption of high-cholesterol and high-saturated fat animal products, a plant-based diet may contribute to a decreased risk of ED.  5. Prevention of Chronic Conditions: A plant-based diet can help prevent or manage chronic conditions such as obesity, diabetes, and hypertension. These conditions can contribute to urology-related issues, including urinary incontinence and kidney dysfunction. By maintaining a healthy weight and managing these conditions, you can reduce the risk of urology-related complications.  Conclusion: Embracing a plant-based diet can offer significant benefits for urology health. By incorporating a variety of colorful fruits, vegetables, whole grains, nuts, seeds, and legumes into your meals, you can support kidney health, prostate health, bladder function, and overall well-being. Remember to consult with a healthcare professional or registered dietitian before making any significant dietary changes, especially if you have existing health conditions. Your personalized approach to a plant-based diet can contribute to improved urology health and enhance your quality of life.       I highly recommend " the game changers" documentary about plant-based diets

## 2023-02-21 ENCOUNTER — Telehealth: Payer: Self-pay | Admitting: Psychiatry

## 2023-02-21 NOTE — Telephone Encounter (Signed)
Message from 02/18/23 Patient does not want a virtual appointment. No privacy and it did not work one time before. Not sure when he can come in person again. Wants to cancel the appointment. I did call and left message for him to call and reschedule for in person if wanted.

## 2023-02-25 ENCOUNTER — Telehealth: Payer: Self-pay | Admitting: Psychiatry

## 2023-07-04 ENCOUNTER — Ambulatory Visit: Payer: 59 | Admitting: Internal Medicine

## 2023-08-25 ENCOUNTER — Telehealth: Admitting: Physician Assistant

## 2023-08-25 DIAGNOSIS — R051 Acute cough: Secondary | ICD-10-CM

## 2023-08-25 MED ORDER — PREDNISONE 20 MG PO TABS
40.0000 mg | ORAL_TABLET | Freq: Every day | ORAL | 0 refills | Status: DC
Start: 2023-08-25 — End: 2024-03-14

## 2023-08-25 MED ORDER — BENZONATATE 100 MG PO CAPS
100.0000 mg | ORAL_CAPSULE | Freq: Three times a day (TID) | ORAL | 0 refills | Status: DC | PRN
Start: 2023-08-25 — End: 2024-03-14

## 2023-08-25 NOTE — Patient Instructions (Signed)
 Travis Bennett, thank you for joining Angelia Kelp, PA-C for today's virtual visit.  While this provider is not your primary care provider (PCP), if your PCP is located in our provider database this encounter information will be shared with them immediately following your visit.   A [facility] MyChart account gives you access to today's visit and all your visits, tests, and labs performed at [facility] " click here if you don't have a [name] MyChart account or go to [email or web address]  Consent: (Patient) Travis Bennett provided verbal consent for this virtual visit at the beginning of the encounter.  Current Medications:  Current Outpatient Medications:    benzonatate  (TESSALON ) 100 MG capsule, Take 1-2 capsules (100-200 mg total) by mouth 3 (three) times daily as needed., Disp: 30 capsule, Rfl: 0   predniSONE  (DELTASONE ) 20 MG tablet, Take 2 tablets (40 mg total) by mouth daily with breakfast., Disp: 10 tablet, Rfl: 0   loperamide (IMODIUM A-D) 2 MG tablet, Take 2 mg by mouth daily., Disp: , Rfl:    melatonin 5 MG TABS, Take 5 mg by mouth at bedtime., Disp: , Rfl:    sertraline  (ZOLOFT ) 50 MG tablet, Take 1 tablet (50 mg total) by mouth daily., Disp: 30 tablet, Rfl: 3   tadalafil  (CIALIS ) 5 MG tablet, Take 1 tablet (5 mg total) by mouth daily as needed for erectile dysfunction (take 45 minutes prior to sexual activity)., Disp: 30 tablet, Rfl: 6   Medications ordered in this encounter:  Meds ordered this encounter  Medications   predniSONE  (DELTASONE ) 20 MG tablet    Sig: Take 2 tablets (40 mg total) by mouth daily with breakfast.    Dispense:  10 tablet    Refill:  0    Supervising Provider:   LAMPTEY, PHILIP O [[id number]]   benzonatate  (TESSALON ) 100 MG capsule    Sig: Take 1-2 capsules (100-200 mg total) by mouth 3 (three) times daily as needed.    Dispense:  30 capsule    Refill:  0    Supervising Provider:   Corine Dice [[id number]]     *If you  need refills on other medications prior to your next appointment, please contact your pharmacy*  Follow-Up: Call back or seek an in-person evaluation if the symptoms worsen or if the condition fails to improve as anticipated.  [facility] [phone]  Other Instructions Cough, Adult Coughing is a reflex that clears your throat and airways (respiratory system). It helps heal and protect your lungs. It is normal to cough from time to time. A cough that happens with other symptoms or that lasts a long time may be a sign of a condition that needs treatment. A short-term (acute) cough may only last 2-3 weeks. A long-term (chronic) cough may last 8 or more weeks. Coughing is often caused by: Diseases, such as: An infection of the respiratory system. Asthma or other heart or lung diseases. Gastroesophageal reflux. This is when acid comes back up from the stomach. Breathing in things that irritate your lungs. Allergies. Postnasal drip. This is when mucus runs down the back of your throat. Smoking. Some medicines. Follow these instructions at home: Medicines Take over-the-counter and prescription medicines only as told by your health care provider. Talk with your provider before you take cough medicine (cough suppressants). Eating and drinking Do not drink alcohol. Avoid caffeine. Drink enough fluid to keep your pee (urine) pale yellow. Lifestyle Avoid cigarette smoke. Do not use any products that  contain nicotine or tobacco. These products include cigarettes, chewing tobacco, and vaping devices, such as e-cigarettes. If you need help quitting, ask your provider. Avoid things that make you cough. These may include perfumes, candles, cleaning products, or campfire smoke. General instructions  Watch for any changes to your cough. Tell your provider about them. Always cover your mouth when you cough. If the air is dry in your bedroom or home, use a cool mist vaporizer or  humidifier. If your cough is worse at night, try to sleep in a semi-upright position. Rest as needed. Contact a health care provider if: You have new symptoms, or your symptoms get worse. You cough up pus. You have a fever that does not go away or a cough that does not get better after 2-3 weeks. You cannot control your cough with medicine, and you are losing sleep. You have pain that gets worse or is not helped with medicine. You lose weight for no clear reason. You have night sweats. Get help right away if: You cough up blood. You have trouble breathing. Your heart is beating very fast. These symptoms may be an emergency. Get help right away. Call 911. Do not wait to see if the symptoms will go away. Do not drive yourself to the hospital. This information is not intended to replace advice given to you by your health care provider. Make sure you discuss any questions you have with your health care provider. Document Revised: 12/04/2021 Document Reviewed: 12/04/2021 Elsevier Patient Education  2024 Elsevier Inc.   If you have been instructed to have an in-person evaluation today at a local Urgent Care facility, please use the link below. It will take you to a list of all of our available [facility], including address, phone number and hours of operation. Please do not delay care.  [facility]  If you or a family member do not have a primary care provider, use the link below to schedule a visit and establish care. When you choose a [facility] primary care physician or advanced practice provider, you gain a long-term partner in health. Find a Primary Care Provider  Learn more about [facility] in-office and virtual care options: [facility] - Get Care Now

## 2023-08-25 NOTE — Progress Notes (Signed)
 Virtual Visit Consent   [name], you are scheduled for a virtual visit with a [facility] provider today. Just as with appointments in the office, your consent must be obtained to participate. Your consent will be active for this visit and any virtual visit you may have with one of our providers in the next 365 days. If you have a MyChart account, a copy of this consent can be sent to you electronically.  As this is a virtual visit, video technology does not allow for your provider to perform a traditional examination. This may limit your provider's ability to fully assess your condition. If your provider identifies any concerns that need to be evaluated in person or the need to arrange testing (such as labs, EKG, etc.), we will make arrangements to do so. Although advances in technology are sophisticated, we cannot ensure that it will always work on either your end or our end. If the connection with a video visit is poor, the visit may have to be switched to a telephone visit. With either a video or telephone visit, we are not always able to ensure that we have a secure connection.  By engaging in this virtual visit, you consent to the provision of healthcare and authorize for your insurance to be billed (if applicable) for the services provided during this visit. Depending on your insurance coverage, you may receive a charge related to this service.  I need to obtain your verbal consent now. Are you willing to proceed with your visit today? Kaihan Dimarco has provided verbal consent on 08/25/2023 for a virtual visit (video or telephone). Angelia Kelp, PA-C  Date: 08/25/2023 10:37 AM   Virtual Visit via Video Note   I, Angelia Kelp, connected with  Travis Bennett  ([id number], [birth year]) on 08/25/23 at 10:30 AM EDT by a video-enabled telemedicine application and verified that I am speaking with the correct person using two identifiers.  Location: Patient: Virtual Visit Location  Patient: Home Provider: Virtual Visit Location Provider: Home Office   I discussed the limitations of evaluation and management by telemedicine and the availability of in person appointments. The patient expressed understanding and agreed to proceed.    History of Present Illness: Travis Bennett is a 58 y.o. who identifies as a male who was assigned male at birth, and is being seen today for cough.  HPI: Cough This is a new problem. The current episode started 1 to 4 weeks ago (10 days). The problem has been gradually worsening. The problem occurs every few minutes. The cough is Non-productive. Associated symptoms include headaches and postnasal drip. Pertinent negatives include no chest pain, chills, ear congestion, ear pain, fever, myalgias, nasal congestion, rhinorrhea, sore throat, shortness of breath, sweats or wheezing. The symptoms are aggravated by pollens. Treatments tried: acetaminophen , tea, ibuprofen, vitamins. The treatment provided no relief. His past medical history is significant for environmental allergies.     Problems:  Patient Active Problem List   Diagnosis Date Noted   Prostate cancer screening 12/02/2021   Family history of colonic polyps 10/16/2018   Erectile dysfunction 09/10/2018   Elevated blood pressure reading with diagnosis of hypertension 08/31/2016   Fatigue 02/02/2016   Overweight (BMI 25.0-29.9) 01/09/2014   Encounter for preventive health examination 01/09/2014   History of hematuria 07/03/2012   Anxiety with depression 07/03/2012   Irritable bowel syndrome     Allergies: No Known Allergies Medications:  Current Outpatient Medications:    benzonatate  (TESSALON ) 100 MG capsule, Take 1-2  capsules (100-200 mg total) by mouth 3 (three) times daily as needed., Disp: 30 capsule, Rfl: 0   predniSONE  (DELTASONE ) 20 MG tablet, Take 2 tablets (40 mg total) by mouth daily with breakfast., Disp: 10 tablet, Rfl: 0   loperamide (IMODIUM A-D) 2 MG tablet, Take 2 mg  by mouth daily., Disp: , Rfl:    melatonin 5 MG TABS, Take 5 mg by mouth at bedtime., Disp: , Rfl:    sertraline  (ZOLOFT ) 50 MG tablet, Take 1 tablet (50 mg total) by mouth daily., Disp: 30 tablet, Rfl: 3   tadalafil  (CIALIS ) 5 MG tablet, Take 1 tablet (5 mg total) by mouth daily as needed for erectile dysfunction (take 45 minutes prior to sexual activity)., Disp: 30 tablet, Rfl: 6  Observations/Objective: Patient is well-developed, well-nourished in no acute distress.  Resting comfortably at home.  Head is normocephalic, atraumatic.  No labored breathing.  Speech is clear and coherent with logical content.  Patient is alert and oriented at baseline.    Assessment and Plan: 1. Acute cough (Primary) - predniSONE  (DELTASONE ) 20 MG tablet; Take 2 tablets (40 mg total) by mouth daily with breakfast.  Dispense: 10 tablet; Refill: 0 - benzonatate  (TESSALON ) 100 MG capsule; Take 1-2 capsules (100-200 mg total) by mouth 3 (three) times daily as needed.  Dispense: 30 capsule; Refill: 0  - Suspect possible post viral cough syndrome vs post nasal drip cough - Will prescribe Prednisone  for inflammation - Add Tessalon  perles for cough - Steam and Humidifier can help - Saline nasal rinses - Push fluids and stay hydrated - Seek further evaluation if not improving or if symptoms worsen  Follow Up Instructions: I discussed the assessment and treatment plan with the patient. The patient was provided an opportunity to ask questions and all were answered. The patient agreed with the plan and demonstrated an understanding of the instructions.  A copy of instructions were sent to the patient via MyChart unless otherwise noted below.    The patient was advised to call back or seek an in-person evaluation if the symptoms worsen or if the condition fails to improve as anticipated.    Angelia Kelp, PA-C

## 2023-10-23 ENCOUNTER — Encounter: Payer: Self-pay | Admitting: Internal Medicine

## 2023-11-20 ENCOUNTER — Encounter: Payer: Self-pay | Admitting: Internal Medicine

## 2024-01-02 ENCOUNTER — Encounter: Payer: 59 | Admitting: Internal Medicine

## 2024-03-14 ENCOUNTER — Ambulatory Visit (INDEPENDENT_AMBULATORY_CARE_PROVIDER_SITE_OTHER): Admitting: Internal Medicine

## 2024-03-14 ENCOUNTER — Encounter: Payer: Self-pay | Admitting: Internal Medicine

## 2024-03-14 VITALS — BP 110/80 | HR 69 | Ht 72.0 in | Wt 211.4 lb

## 2024-03-14 DIAGNOSIS — Z0000 Encounter for general adult medical examination without abnormal findings: Secondary | ICD-10-CM

## 2024-03-14 DIAGNOSIS — R350 Frequency of micturition: Secondary | ICD-10-CM

## 2024-03-14 DIAGNOSIS — R5383 Other fatigue: Secondary | ICD-10-CM

## 2024-03-14 DIAGNOSIS — Z113 Encounter for screening for infections with a predominantly sexual mode of transmission: Secondary | ICD-10-CM | POA: Diagnosis not present

## 2024-03-14 DIAGNOSIS — R7301 Impaired fasting glucose: Secondary | ICD-10-CM | POA: Diagnosis not present

## 2024-03-14 DIAGNOSIS — K580 Irritable bowel syndrome with diarrhea: Secondary | ICD-10-CM

## 2024-03-14 DIAGNOSIS — F418 Other specified anxiety disorders: Secondary | ICD-10-CM

## 2024-03-14 DIAGNOSIS — E785 Hyperlipidemia, unspecified: Secondary | ICD-10-CM | POA: Diagnosis not present

## 2024-03-14 DIAGNOSIS — E663 Overweight: Secondary | ICD-10-CM

## 2024-03-14 DIAGNOSIS — Z125 Encounter for screening for malignant neoplasm of prostate: Secondary | ICD-10-CM

## 2024-03-14 LAB — COMPREHENSIVE METABOLIC PANEL WITH GFR
ALT: 23 U/L (ref 0–53)
AST: 23 U/L (ref 0–37)
Albumin: 5 g/dL (ref 3.5–5.2)
Alkaline Phosphatase: 68 U/L (ref 39–117)
BUN: 19 mg/dL (ref 6–23)
CO2: 30 meq/L (ref 19–32)
Calcium: 9.7 mg/dL (ref 8.4–10.5)
Chloride: 100 meq/L (ref 96–112)
Creatinine, Ser: 1.12 mg/dL (ref 0.40–1.50)
GFR: 73.42 mL/min (ref >60.00)
Glucose, Bld: 105 mg/dL — ABNORMAL HIGH (ref 70–99)
Potassium: 5 meq/L (ref 3.5–5.1)
Sodium: 137 meq/L (ref 135–145)
Total Bilirubin: 1.2 mg/dL (ref 0.2–1.2)
Total Protein: 7.1 g/dL (ref 6.0–8.3)

## 2024-03-14 LAB — URINALYSIS, ROUTINE W REFLEX MICROSCOPIC
Bilirubin Urine: NEGATIVE
Hgb urine dipstick: NEGATIVE
Ketones, ur: NEGATIVE
Leukocytes,Ua: NEGATIVE
Nitrite: NEGATIVE
RBC / HPF: NONE SEEN (ref 0–?)
Specific Gravity, Urine: <=1.005 — AB (ref 1.000–1.030)
Total Protein, Urine: NEGATIVE
Urine Glucose: NEGATIVE
Urobilinogen, UA: 0.2 (ref 0.0–1.0)
WBC, UA: NONE SEEN (ref 0–?)
pH: 7 (ref 5.0–8.0)

## 2024-03-14 LAB — CBC WITH DIFFERENTIAL/PLATELET
Basophils Absolute: 0.1 K/uL (ref 0.0–0.1)
Basophils Relative: 1 % (ref 0.0–3.0)
Eosinophils Absolute: 0.1 K/uL (ref 0.0–0.7)
Eosinophils Relative: 0.9 % (ref 0.0–5.0)
HCT: 45.7 % (ref 39.0–52.0)
Hemoglobin: 15.8 g/dL (ref 13.0–17.0)
Lymphocytes Relative: 17.2 % (ref 12.0–46.0)
Lymphs Abs: 1 K/uL (ref 0.7–4.0)
MCHC: 34.5 g/dL (ref 30.0–36.0)
MCV: 86.8 fl (ref 78.0–100.0)
Monocytes Absolute: 0.6 K/uL (ref 0.1–1.0)
Monocytes Relative: 10.6 % (ref 3.0–12.0)
Neutro Abs: 3.9 K/uL (ref 1.4–7.7)
Neutrophils Relative %: 70.3 % (ref 43.0–77.0)
Platelets: 193 K/uL (ref 150.0–400.0)
RBC: 5.27 Mil/uL (ref 4.22–5.81)
RDW: 13.3 % (ref 11.5–15.5)
WBC: 5.5 K/uL (ref 4.0–10.5)

## 2024-03-14 LAB — LIPID PANEL
Cholesterol: 206 mg/dL — ABNORMAL HIGH (ref 0–200)
HDL: 49.5 mg/dL (ref >39.00)
LDL Cholesterol: 132 mg/dL — ABNORMAL HIGH (ref 0–99)
NonHDL: 156.37
Total CHOL/HDL Ratio: 4
Triglycerides: 122 mg/dL (ref 0.0–149.0)
VLDL: 24.4 mg/dL (ref 0.0–40.0)

## 2024-03-14 LAB — HEMOGLOBIN A1C: Hgb A1c MFr Bld: 4.8 % (ref 4.6–6.5)

## 2024-03-14 LAB — PSA: PSA: 3.92 ng/mL (ref 0.10–4.00)

## 2024-03-14 LAB — LDL CHOLESTEROL, DIRECT: Direct LDL: 145 mg/dL

## 2024-03-14 LAB — TSH: TSH: 2.79 u[IU]/mL (ref 0.35–5.50)

## 2024-03-14 MED ORDER — TADALAFIL 5 MG PO TABS: 5.0000 mg | ORAL_TABLET | Freq: Every day | ORAL | 6 refills | Status: AC | PRN

## 2024-03-14 NOTE — Progress Notes (Signed)
 Patient ID: Travis Bennett, male    DOB: January 05, 1966  Age: 58 y.o. MRN: [id number]  The patient is here for annual preventive examination and management of other chronic and acute problems.   The risk factors are reflected in the social history.   The roster of all physicians providing medical care to patient - is listed in the Snapshot section of the chart.   Activities of daily living:  The patient is 100% independent in all ADLs: dressing, toileting, feeding as well as independent mobility   Home safety : The patient has smoke detectors in the home. They wear seatbelts.  There are no unsecured firearms at home. There is no violence in the home.    There is no risks for hepatitis, STDs or HIV. There is no   history of blood transfusion. They have no travel history to infectious disease endemic areas of the world.   The patient has seen their dentist in the last six month. They have seen their eye doctor in the last year. The patinet  denies slight hearing difficulty with regard to whispered voices and some television programs.  They have deferred audiologic testing in the last year.  They do not  have excessive sun exposure. Discussed the need for sun protection: hats, long sleeves and use of sunscreen if there is significant sun exposure.    Diet: the importance of a healthy diet is discussed. They do have a healthy diet.   The benefits of regular aerobic exercise were discussed. The patient  exercises  3 to 5 days per week  for  60 minutes.    Depression screen: there are no signs or vegative symptoms of depression- irritability, change in appetite, anhedonia, sadness/tearfullness.   The following portions of the patient's history were reviewed and updated as appropriate: allergies, current medications, past family history, past medical history,  past surgical history, past social history  and problem list.   Visual acuity was not assessed per patient preference since the patient has regular  follow up with an  ophthalmologist. Hearing and body mass index were assessed and reviewed.    During the course of the visit the patient was educated and counseled about appropriate screening and preventive services including : fall prevention , diabetes screening, nutrition counseling, colorectal cancer screening, and recommended immunizations.    Chief Complaint:  1) sprained ankle in July, two episodes. First occurred while in [place] with wife Travis Bennett for log overudfe vacation.  2nd time went to  [facility] walk in clinic.    2) treated for otitis media oct 29 caused by excessive earwax that   3) ED,  elevated PSA saw Travis Bennett,  started  cialis  but has stopped cialis  and sertraline   4) IBS/diarrhea managed with Imodium  5) obesity : lost weight intentionally   6) stress: job related ,. Got fired in August  toxic environment . Harassed even while on vacation in [place] by boss .  Was terminated  without benefits in August a month after his vacation. Complicated by hateful x wife   6 kides to supprot from combined 2nd marriage    Cc: 1) requesting referral to Travis Bennett , now on [employer] 2) Brother has  severe  OSA , patient waking up with tachycardia ,  does not snore routinely ,  sleeps with melatonin,  wakes up  worrying about finances  3) still having ED despite stopping sertraline .  Wants to resume cialis  4) excessive urination  Review of Symptoms  Patient denies headache, fevers, malaise, unintentional weight loss, skin rash, eye pain, sinus congestion and sinus pain, sore throat, dysphagia,  hemoptysis , cough, dyspnea, wheezing, chest pain, palpitations, orthopnea, edema, abdominal pain, nausea, melena, diarrhea, constipation, flank pain, dysuria, hematuria, urinary  Frequency, nocturia, numbness, tingling, seizures,  Focal weakness, Loss of consciousness,  Tremor, insomnia, depression, anxiety, and suicidal ideation.    Physical Exam:  BP 110/80   Pulse 69   Ht 6'  (1.829 m)   Wt 211 lb 6.4 oz (95.9 kg)   SpO2 96%   BMI 28.67 kg/m    Physical Exam  Assessment and Plan: Encounter for preventive health examination Assessment & Plan: age appropriate education and counseling updated, referrals for preventative services and immunizations addressed, dietary and smoking counseling addressed, most recent labs reviewed.  I have personally reviewed and have noted:   1) the patient's medical and social history 2) The pt's use of alcohol, tobacco, and illicit drugs 3) The patient's current medications and supplements 4) Functional ability including ADL's, fall risk, home safety risk, hearing and visual impairment 5) Diet and physical activities 6) Evidence for depression or mood disorder 7) The patient's height, weight, and BMI have been recorded in the chart   Fatigue, unspecified type Assessment & Plan: Checking thyroid  level and electroytes.  Likely multifactorial   Lab Results  Component Value Date   TSH 2.79 03/14/2024   Lab Results  Component Value Date   WBC 5.5 03/14/2024   HGB 15.8 03/14/2024   HCT 45.7 03/14/2024   MCV 86.8 03/14/2024   PLT 193.0 03/14/2024   No results found for: VITAMINB12    Orders: -     CBC with Differential/Platelet -     TSH  Prostate cancer screening -     PSA  Hyperlipidemia, unspecified hyperlipidemia type -     Lipid panel -     LDL cholesterol, direct  Impaired fasting glucose -     Comprehensive metabolic panel with GFR -     Hemoglobin A1c  Urinary frequency -     Urinalysis, Routine w reflex microscopic -     Urine Culture  Screen for STD (sexually transmitted disease) -     HIV Antibody (routine testing w rflx) -     Hepatitis C antibody  Anxiety with depression Assessment & Plan: Resuming therapy with sertraline ,  referring to  D r Bennett for ongoing treatment   Orders: -     Ambulatory referral to Psychiatry  Overweight (BMI 25.0-29.9) Assessment & Plan: He is losing  weight intentionally with dietary modifications and exercise.    Irritable bowel syndrome with diarrhea Assessment & Plan: Managed with dietary restrictions  and limited use of Imodium    Other orders -     Tadalafil ; Take 1 tablet (5 mg total) by mouth daily as needed for erectile dysfunction (take 45 minutes prior to sexual activity).  Dispense: 30 tablet; Refill: 6    No follow-ups on file.  Verneita LITTIE Kettering, MD

## 2024-03-16 ENCOUNTER — Ambulatory Visit: Payer: Self-pay | Admitting: Internal Medicine

## 2024-03-16 DIAGNOSIS — I10 Essential (primary) hypertension: Secondary | ICD-10-CM

## 2024-03-16 LAB — HIV ANTIBODY (ROUTINE TESTING W REFLEX)
HIV 1&2 Ab, 4th Generation: NONREACTIVE
HIV FINAL INTERPRETATION: NEGATIVE

## 2024-03-16 LAB — URINE CULTURE
MICRO NUMBER:: 17287223
Result:: NO GROWTH
SPECIMEN QUALITY:: ADEQUATE

## 2024-03-16 LAB — HEPATITIS C ANTIBODY: Hepatitis C Ab: NONREACTIVE

## 2024-03-16 NOTE — Assessment & Plan Note (Signed)

## 2024-03-16 NOTE — Assessment & Plan Note (Signed)
 He is losing weight intentionally with dietary modifications and exercise.

## 2024-03-16 NOTE — Assessment & Plan Note (Signed)
 Resuming therapy with sertraline ,  referring to  D r Kapur for ongoing treatment

## 2024-03-16 NOTE — Assessment & Plan Note (Signed)
 Checking thyroid  level and electroytes.  Likely multifactorial   Lab Results  Component Value Date   TSH 2.79 03/14/2024   Lab Results  Component Value Date   WBC 5.5 03/14/2024   HGB 15.8 03/14/2024   HCT 45.7 03/14/2024   MCV 86.8 03/14/2024   PLT 193.0 03/14/2024   No results found for: [id number]

## 2024-03-16 NOTE — Assessment & Plan Note (Signed)
 He has a prior history of hypertension. He  has been advised t o check his blood pressure several times over the next 3-4 weeks and to submit readings for evaluation. Urine microalbumin to creatinine ratio done today is normal.  No results found for: LABMICR, MICROALBUR 4 Lab Results  Component Value Date   CREATININE 1.12 03/14/2024   Lab Results  Component Value Date   NA 137 03/14/2024   K 5.0 03/14/2024   CL 100 03/14/2024   CO2 30 03/14/2024

## 2024-03-16 NOTE — Assessment & Plan Note (Signed)
 Managed with dietary restrictions  and limited use of Imodium

## 2025-03-18 ENCOUNTER — Encounter: Admitting: Internal Medicine
